# Patient Record
Sex: Male | Born: 1995
Health system: Southern US, Community
[De-identification: ages and names within clinical notes are randomized; demographics above are authoritative.]

## PROBLEM LIST (undated history)

## (undated) DIAGNOSIS — F32A Depression, unspecified: Secondary | ICD-10-CM

## (undated) DIAGNOSIS — G43019 Migraine without aura, intractable, without status migrainosus: Secondary | ICD-10-CM

## (undated) DIAGNOSIS — F410 Panic disorder [episodic paroxysmal anxiety] without agoraphobia: Secondary | ICD-10-CM

## (undated) DIAGNOSIS — T7840XA Allergy, unspecified, initial encounter: Secondary | ICD-10-CM

## (undated) DIAGNOSIS — F329 Major depressive disorder, single episode, unspecified: Secondary | ICD-10-CM

## (undated) DIAGNOSIS — G250 Essential tremor: Secondary | ICD-10-CM

## (undated) HISTORY — DX: Essential tremor: G25.0

## (undated) HISTORY — DX: Migraine without aura, intractable, without status migrainosus: G43.019

## (undated) HISTORY — PX: FINGER SURGERY: SHX640

## (undated) HISTORY — DX: Allergy, unspecified, initial encounter: T78.40XA

---
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
## 2013-02-20 ENCOUNTER — Encounter: Payer: Self-pay | Admitting: Family Medicine

## 2013-02-20 ENCOUNTER — Ambulatory Visit (INDEPENDENT_AMBULATORY_CARE_PROVIDER_SITE_OTHER): Payer: Medicaid Other | Admitting: Family Medicine

## 2013-02-20 VITALS — BP 130/74 | HR 58 | Temp 98.1°F | Resp 14 | Ht 75.0 in | Wt 214.0 lb

## 2013-02-20 DIAGNOSIS — Z9109 Other allergy status, other than to drugs and biological substances: Secondary | ICD-10-CM

## 2013-02-20 DIAGNOSIS — T7840XA Allergy, unspecified, initial encounter: Secondary | ICD-10-CM | POA: Insufficient documentation

## 2013-02-20 MED ORDER — LORATADINE 10 MG PO TABS: 10.0000 mg | ORAL_TABLET | Freq: Every day | ORAL | Status: DC

## 2013-02-20 MED ORDER — FLUTICASONE PROPIONATE 50 MCG/ACT NA SUSP: 2.0000 | Freq: Every day | NASAL | Status: DC

## 2013-02-24 ENCOUNTER — Encounter: Payer: Self-pay | Admitting: Family Medicine

## 2013-02-24 NOTE — Progress Notes (Signed)
  Subjective:    Patient ID: Wyatt Alexander, male    DOB: [birth year], 17 y.o.   MRN: [id number]  HPI  New patient here to establish her allergies.  He ports constant rhinorrhea, sneezing, itchy watery eyes, cough, sinus congestion for several weeks. In the past he has been on Claritin 10 mg by mouth daily and Flonase nasal spray. This is work adequately in the past. He denies any sinus pain. He denies any fever. He denies any headaches.  He is not currently taking any medication for his allergies.  He denies any history of eczema or asthma. Past Medical History  Diagnosis Date  . Allergy    No current outpatient prescriptions on file prior to visit.   No current facility-administered medications on file prior to visit.   History   Social History  . Marital Status: Single    Spouse Name: N/A    Number of Children: N/A  . Years of Education: N/A   Occupational History  . Not on file.   Social History Main Topics  . Smoking status: Never Smoker   . Smokeless tobacco: Never Used  . Alcohol Use: No  . Drug Use: No  . Sexually Active: Not on file     Comment: Attending community college with plans to transfer to 4 year institution.   Other Topics Concern  . Not on file   Social History Narrative  . No narrative on file   No family history on file. His mom and dad are both living he has 2 sisters and 2 brothers without significant medical problems. .  Review of Systems  Constitutional: Negative.   HENT: Positive for congestion, rhinorrhea, sneezing and postnasal drip. Negative for hearing loss, ear pain, nosebleeds, tinnitus and ear discharge.   Eyes: Positive for itching. Negative for pain, discharge, redness and visual disturbance.  Respiratory: Positive for cough. Negative for apnea, choking, chest tightness, shortness of breath and wheezing.   Cardiovascular: Negative.   Gastrointestinal: Negative.   Allergic/Immunologic: Positive for environmental allergies. Negative for  food allergies and immunocompromised state.       Objective:   Physical Exam  Constitutional: He appears well-developed and well-nourished.  HENT:  Head: Normocephalic and atraumatic.  Right Ear: External ear normal.  Left Ear: External ear normal.  Nose: Nose normal.  Mouth/Throat: Oropharynx is clear and moist.  Eyes: Conjunctivae and EOM are normal. Pupils are equal, round, and reactive to light.  Neck: Normal range of motion. Neck supple. No JVD present. No thyromegaly present.  Cardiovascular: Normal rate, regular rhythm and normal heart sounds.  Exam reveals no gallop and no friction rub.   No murmur heard. Pulmonary/Chest: Effort normal and breath sounds normal. No respiratory distress. He has no wheezes. He has no rales. He exhibits no tenderness.  Abdominal: Soft. Bowel sounds are normal. He exhibits no distension. There is no tenderness. There is no rebound.  Lymphadenopathy:    He has no cervical adenopathy.          Assessment & Plan:  1. Environmental allergies Prescribed patient Claritin 10 mg by mouth daily and Flonase 2 sprays each nostril inhaled daily. If symptoms do not improve could consider adding Singulair. Follow up when necessary.

## 2014-02-13 ENCOUNTER — Ambulatory Visit: Payer: Medicaid Other | Admitting: Family Medicine

## 2014-02-13 ENCOUNTER — Encounter: Payer: Self-pay | Admitting: Family Medicine

## 2014-02-13 ENCOUNTER — Ambulatory Visit (INDEPENDENT_AMBULATORY_CARE_PROVIDER_SITE_OTHER): Payer: Medicaid Other | Admitting: Family Medicine

## 2014-02-13 VITALS — BP 128/74 | HR 76 | Temp 98.2°F | Resp 14 | Ht 74.0 in | Wt 245.0 lb

## 2014-02-13 DIAGNOSIS — G250 Essential tremor: Secondary | ICD-10-CM | POA: Insufficient documentation

## 2014-02-13 DIAGNOSIS — G252 Other specified forms of tremor: Secondary | ICD-10-CM

## 2014-02-13 MED ORDER — LORATADINE 10 MG PO TABS: 10.0000 mg | ORAL_TABLET | Freq: Every day | ORAL | Status: DC

## 2014-02-13 MED ORDER — PROPRANOLOL HCL ER 60 MG PO CP24: 60.0000 mg | ORAL_CAPSULE | Freq: Every day | ORAL | Status: DC

## 2014-02-13 MED ORDER — FLUTICASONE PROPIONATE 50 MCG/ACT NA SUSP: 2.0000 | Freq: Every day | NASAL | Status: DC

## 2014-02-13 NOTE — Progress Notes (Signed)
   Subjective:    Patient ID: Wyatt MalletDaniel Alexander, male    DOB: 12-02-1995, 18 y.o.   MRN: [id number]  HPI Patient presents today with his mother. He is requesting a refill on his allergy medicine. He is also concerned about a tremor that he is developed in both hands. It has been there for over a year. It is a fine resting tremor. It is worse whenever he tries to concentrate and hold his hands steady.  He denies any other neurologic deficits. He has no evidence of hyperreflexia or other upper motor neuron symptoms.  He denies caffeine abuse. He denies stimulant abuse. He denies anxiety. Past Medical History  Diagnosis Date  . Allergy   . Essential tremor    No current outpatient prescriptions on file prior to visit.   No current facility-administered medications on file prior to visit.   No Known Allergies History   Social History  . Marital Status: Single    Spouse Name: N/A    Number of Children: N/A  . Years of Education: N/A   Occupational History  . Not on file.   Social History Main Topics  . Smoking status: Never Smoker   . Smokeless tobacco: Never Used  . Alcohol Use: No  . Drug Use: No  . Sexual Activity: Not on file     Comment: Attending community college with plans to transfer to 4 year institution.   Other Topics Concern  . Not on file   Social History Narrative  . No narrative on file      Review of Systems  All other systems reviewed and are negative.      Objective:   Physical Exam  Vitals reviewed. Constitutional: He is oriented to person, place, and time. He appears well-developed and well-nourished. No distress.  HENT:  Head: Normocephalic and atraumatic.  Right Ear: External ear normal.  Left Ear: External ear normal.  Nose: Nose normal.  Mouth/Throat: Oropharynx is clear and moist.  Eyes: Conjunctivae and EOM are normal. Pupils are equal, round, and reactive to light. Right eye exhibits no discharge. Left eye exhibits no discharge. No scleral  icterus.  Neck: Neck supple. No thyromegaly present.  Cardiovascular: Normal rate, regular rhythm, normal heart sounds and intact distal pulses.  Exam reveals no gallop and no friction rub.   No murmur heard. Pulmonary/Chest: Effort normal and breath sounds normal. No respiratory distress. He has no wheezes. He has no rales. He exhibits no tenderness.  Lymphadenopathy:    He has no cervical adenopathy.  Neurological: He is alert and oriented to person, place, and time. He has normal reflexes. He displays normal reflexes. No cranial nerve deficit. He exhibits normal muscle tone. Coordination normal.  Skin: He is not diaphoretic.          Assessment & Plan:  1. Essential tremor Try Inderal LA 60 mg by mouth daily. Recheck in one month.  Monitor for hypotension or fatigue. - propranolol ER (INDERAL LA) 60 MG 24 hr capsule; Take 1 capsule (60 mg total) by mouth daily.  Dispense: 30 capsule; Refill: 5

## 2014-02-20 ENCOUNTER — Ambulatory Visit: Payer: Medicaid Other | Admitting: Family Medicine

## 2014-09-03 ENCOUNTER — Ambulatory Visit: Payer: Medicaid Other

## 2015-03-18 ENCOUNTER — Encounter: Payer: Self-pay | Admitting: Family Medicine

## 2015-03-18 ENCOUNTER — Ambulatory Visit (INDEPENDENT_AMBULATORY_CARE_PROVIDER_SITE_OTHER): Payer: Medicaid Other | Admitting: Family Medicine

## 2015-03-18 VITALS — BP 108/64 | HR 68 | Temp 98.5°F | Resp 14 | Ht 74.0 in | Wt 228.0 lb

## 2015-03-18 DIAGNOSIS — G250 Essential tremor: Secondary | ICD-10-CM | POA: Diagnosis not present

## 2015-03-18 DIAGNOSIS — R5383 Other fatigue: Secondary | ICD-10-CM | POA: Diagnosis not present

## 2015-03-18 LAB — CBC WITH DIFFERENTIAL/PLATELET
Basophils Absolute: 0.1 10*3/uL (ref 0.0–0.1)
Basophils Relative: 1 % (ref 0–1)
EOS ABS: 0.2 10*3/uL (ref 0.0–0.7)
Eosinophils Relative: 3 % (ref 0–5)
HEMATOCRIT: 45.6 % (ref 39.0–52.0)
HEMOGLOBIN: 16.1 g/dL (ref 13.0–17.0)
LYMPHS ABS: 1.8 10*3/uL (ref 0.7–4.0)
Lymphocytes Relative: 28 % (ref 12–46)
MCH: 30.3 pg (ref 26.0–34.0)
MCHC: 35.3 g/dL (ref 30.0–36.0)
MCV: 85.9 fL (ref 78.0–100.0)
MONOS PCT: 12 % (ref 3–12)
MPV: 10.2 fL (ref 8.6–12.4)
Monocytes Absolute: 0.8 10*3/uL (ref 0.1–1.0)
NEUTROS ABS: 3.5 10*3/uL (ref 1.7–7.7)
NEUTROS PCT: 56 % (ref 43–77)
Platelets: 183 10*3/uL (ref 150–400)
RBC: 5.31 MIL/uL (ref 4.22–5.81)
RDW: 13.9 % (ref 11.5–15.5)
WBC: 6.3 10*3/uL (ref 4.0–10.5)

## 2015-03-18 LAB — COMPLETE METABOLIC PANEL WITH GFR
ALT: 30 U/L (ref 0–53)
AST: 18 U/L (ref 0–37)
Albumin: 4.6 g/dL (ref 3.5–5.2)
Alkaline Phosphatase: 78 U/L (ref 39–117)
BILIRUBIN TOTAL: 1.2 mg/dL (ref 0.2–1.2)
BUN: 15 mg/dL (ref 6–23)
CALCIUM: 10 mg/dL (ref 8.4–10.5)
CHLORIDE: 103 meq/L (ref 96–112)
CO2: 27 meq/L (ref 19–32)
CREATININE: 0.81 mg/dL (ref 0.50–1.35)
Glucose, Bld: 82 mg/dL (ref 70–99)
Potassium: 3.7 mEq/L (ref 3.5–5.3)
SODIUM: 139 meq/L (ref 135–145)
TOTAL PROTEIN: 6.9 g/dL (ref 6.0–8.3)

## 2015-03-18 LAB — TSH: TSH: 2.247 u[IU]/mL (ref 0.350–4.500)

## 2015-03-18 MED ORDER — FLUTICASONE PROPIONATE 50 MCG/ACT NA SUSP: 2.0000 | Freq: Every day | NASAL | Status: DC

## 2015-03-18 MED ORDER — PROPRANOLOL HCL ER 60 MG PO CP24: 60.0000 mg | ORAL_CAPSULE | Freq: Every day | ORAL | Status: DC

## 2015-03-18 MED ORDER — LORATADINE 10 MG PO TABS: 10.0000 mg | ORAL_TABLET | Freq: Every day | ORAL | Status: DC

## 2015-03-18 NOTE — Progress Notes (Signed)
   Subjective:    Patient ID: Wyatt Alexander, male    DOB: Nov 06, 1996, 19 y.o.   MRN: [id number]  HPI  Patient takes Flonase and Claritin for seasonal allergies. These medications work well to help manage his allergies. He also takes propranolol extended release 60 mg 1 by mouth daily to control an essential tremor. However without the medication he does not have severe problems with tremor. He is not sure whether he needs to continue the medication or not. He does report fatigue. He also reports sleepiness although his schedule is typically going to bed 2 or 3 in the morning and waking up at 10 or 11 in the morning. He denies any chest pain shortness of breath or weight loss. He denies any fevers or chills. He denies any depression or anxiety Past Medical History  Diagnosis Date  . Allergy   . Essential tremor    No past surgical history on file. No current outpatient prescriptions on file prior to visit.   No current facility-administered medications on file prior to visit.   No Known Allergies History   Social History  . Marital Status: Single    Spouse Name: N/A  . Number of Children: N/A  . Years of Education: N/A   Occupational History  . Not on file.   Social History Main Topics  . Smoking status: Never Smoker   . Smokeless tobacco: Never Used  . Alcohol Use: No  . Drug Use: No  . Sexual Activity: Not on file     Comment: Attending community college with plans to transfer to 4 year institution.   Other Topics Concern  . Not on file   Social History Narrative     Review of Systems  All other systems reviewed and are negative.      Objective:   Physical Exam  Constitutional: He is oriented to person, place, and time.  Neck: Neck supple. No thyromegaly present.  Cardiovascular: Normal rate, regular rhythm and normal heart sounds.   No murmur heard. Pulmonary/Chest: Effort normal and breath sounds normal. No respiratory distress. He has no wheezes. He has no  rales.  Abdominal: Soft. Bowel sounds are normal. He exhibits no distension. There is no tenderness. There is no rebound.  Lymphadenopathy:    He has no cervical adenopathy.  Neurological: He is alert and oriented to person, place, and time. He has normal reflexes. He displays normal reflexes. No cranial nerve deficit. He exhibits normal muscle tone. Coordination normal.  Vitals reviewed.         Assessment & Plan:  Essential tremor - Plan: propranolol ER (INDERAL LA) 60 MG 24 hr capsule, CBC with Differential/Platelet, COMPLETE METABOLIC PANEL WITH GFR, TSH  Other fatigue - Plan: CBC with Differential/Platelet, COMPLETE METABOLIC PANEL WITH GFR, TSH  Patient's essential tremor is well controlled on propranolol. I will give him the option of whether or not he wants to continue this medication if it is causing him to be tired and fatigue. I will check a CBC, CMP, and TSH to workup other potential causes of fatigue. I will gladly refill the medication he takes for allergies.

## 2015-03-19 ENCOUNTER — Encounter: Payer: Self-pay | Admitting: Family Medicine

## 2015-05-04 ENCOUNTER — Ambulatory Visit (INDEPENDENT_AMBULATORY_CARE_PROVIDER_SITE_OTHER): Payer: Medicaid Other | Admitting: Family Medicine

## 2015-05-04 DIAGNOSIS — Z23 Encounter for immunization: Secondary | ICD-10-CM | POA: Diagnosis not present

## 2015-12-24 ENCOUNTER — Ambulatory Visit: Payer: Medicaid Other | Admitting: Family Medicine

## 2015-12-29 ENCOUNTER — Ambulatory Visit (INDEPENDENT_AMBULATORY_CARE_PROVIDER_SITE_OTHER): Payer: Self-pay | Admitting: Physician Assistant

## 2015-12-29 ENCOUNTER — Encounter: Payer: Self-pay | Admitting: Physician Assistant

## 2015-12-29 VITALS — BP 114/70 | HR 56 | Temp 98.4°F | Resp 18 | Wt 221.0 lb

## 2015-12-29 DIAGNOSIS — H6123 Impacted cerumen, bilateral: Secondary | ICD-10-CM

## 2015-12-29 NOTE — Progress Notes (Signed)
    Patient ID: Wyatt Alexander MRN: [id number], DOB: Oct 25, 1996, 20 y.o. Date of Encounter: 12/29/2015, 11:52 AM    Chief Complaint:  Chief Complaint  Patient presents with  . c/o plugged ears     HPI: 20 y.o. year old white male says his ears have felt clogged up for quite some time that it is getting worse so finally came in. No other complaints or concerns.     Home Meds:   Outpatient Prescriptions Prior to Visit  Medication Sig Dispense Refill  . fluticasone (FLONASE) 50 MCG/ACT nasal spray Place 2 sprays into both nostrils daily. 16 g 11  . loratadine (CLARITIN) 10 MG tablet Take 1 tablet (10 mg total) by mouth daily. 30 tablet 11  . propranolol ER (INDERAL LA) 60 MG 24 hr capsule Take 1 capsule (60 mg total) by mouth daily. (Patient not taking: Reported on 12/29/2015) 30 capsule 5   No facility-administered medications prior to visit.    Allergies: No Known Allergies    Review of Systems: See HPI for pertinent ROS. All other ROS negative.    Physical Exam: Blood pressure 114/70, pulse 56, temperature 98.4 F (36.9 C), temperature source Oral, resp. rate 18, weight 221 lb (100.245 kg)., Body mass index is 28.36 kg/(m^2). General:  WNWD WM. Appears in no acute distress. HEENT: Normocephalic, atraumatic, eyes without discharge, sclera non-icteric, nares are without discharge. Bilateral auditory canals with cerumen impaction/obstruction.  Oral cavity moist, posterior pharynx without exudate, erythema, peritonsillar abscess, or post nasal drip.  Neck: Supple. No thyromegaly. No lymphadenopathy. Lungs: Clear bilaterally to auscultation without wheezes, rales, or rhonchi. Breathing is unlabored. Heart: Regular rhythm. No murmurs, rubs, or gallops. Msk:  Strength and tone normal for age. Extremities/Skin: Warm and dry.  Neuro: Alert and oriented X 3. Moves all extremities spontaneously. Gait is normal. CNII-XII grossly in tact. Psych:  Responds to questions appropriately with a  normal affect.     ASSESSMENT AND PLAN:  20 y.o. year old male with   1. Cerumen impaction, bilateral We will irrigate ears bilaterally now. Told patient to use over-the-counter drops on a routine basis to prevent re-occurrence. Follow-up PRN.   [address] [place], [place], [facility] 12/29/2015 11:52 AM

## 2016-01-10 ENCOUNTER — Encounter (HOSPITAL_COMMUNITY): Payer: Self-pay | Admitting: Emergency Medicine

## 2016-01-10 ENCOUNTER — Emergency Department (HOSPITAL_COMMUNITY)
Admission: EM | Admit: 2016-01-10 | Discharge: 2016-01-10 | Disposition: A | Discharge status: Home / Self Care | Payer: Medicaid Other | Attending: Emergency Medicine | Admitting: Emergency Medicine

## 2016-01-10 DIAGNOSIS — Z79899 Other long term (current) drug therapy: Secondary | ICD-10-CM | POA: Insufficient documentation

## 2016-01-10 DIAGNOSIS — R51 Headache: Secondary | ICD-10-CM | POA: Insufficient documentation

## 2016-01-10 DIAGNOSIS — R112 Nausea with vomiting, unspecified: Secondary | ICD-10-CM

## 2016-01-10 DIAGNOSIS — R5383 Other fatigue: Secondary | ICD-10-CM | POA: Insufficient documentation

## 2016-01-10 DIAGNOSIS — R197 Diarrhea, unspecified: Secondary | ICD-10-CM | POA: Insufficient documentation

## 2016-01-10 DIAGNOSIS — R079 Chest pain, unspecified: Secondary | ICD-10-CM | POA: Insufficient documentation

## 2016-01-10 DIAGNOSIS — Z7951 Long term (current) use of inhaled steroids: Secondary | ICD-10-CM | POA: Insufficient documentation

## 2016-01-10 LAB — COMPREHENSIVE METABOLIC PANEL
ALBUMIN: 4.4 g/dL (ref 3.5–5.0)
ALT: 23 U/L (ref 17–63)
AST: 32 U/L (ref 15–41)
Alkaline Phosphatase: 78 U/L (ref 38–126)
Anion gap: 10 (ref 5–15)
BUN: 8 mg/dL (ref 6–20)
CHLORIDE: 105 mmol/L (ref 101–111)
CO2: 27 mmol/L (ref 22–32)
CREATININE: 0.84 mg/dL (ref 0.61–1.24)
Calcium: 9.5 mg/dL (ref 8.9–10.3)
GFR calc non Af Amer: >60 mL/min (ref >60)
GLUCOSE: 110 mg/dL — AB (ref 65–99)
Potassium: 3.8 mmol/L (ref 3.5–5.1)
SODIUM: 142 mmol/L (ref 135–145)
Total Bilirubin: 1.5 mg/dL — ABNORMAL HIGH (ref 0.3–1.2)
Total Protein: 7.1 g/dL (ref 6.5–8.1)

## 2016-01-10 LAB — CBC
HCT: 43.7 % (ref 39.0–52.0)
HEMOGLOBIN: 14.9 g/dL (ref 13.0–17.0)
MCH: 29.3 pg (ref 26.0–34.0)
MCHC: 34.1 g/dL (ref 30.0–36.0)
MCV: 86 fL (ref 78.0–100.0)
Platelets: 182 10*3/uL (ref 150–400)
RBC: 5.08 MIL/uL (ref 4.22–5.81)
RDW: 12.4 % (ref 11.5–15.5)
WBC: 6.8 10*3/uL (ref 4.0–10.5)

## 2016-01-10 LAB — URINE MICROSCOPIC-ADD ON: RBC / HPF: NONE SEEN RBC/hpf (ref 0–5)

## 2016-01-10 LAB — URINALYSIS, ROUTINE W REFLEX MICROSCOPIC
GLUCOSE, UA: NEGATIVE mg/dL
Hgb urine dipstick: NEGATIVE
Ketones, ur: 15 mg/dL — AB
LEUKOCYTES UA: NEGATIVE
NITRITE: NEGATIVE
PH: 7 (ref 5.0–8.0)
Protein, ur: 30 mg/dL — AB
SPECIFIC GRAVITY, URINE: 1.028 (ref 1.005–1.030)

## 2016-01-10 LAB — LIPASE, BLOOD: LIPASE: 23 U/L (ref 11–51)

## 2016-01-10 MED ORDER — ONDANSETRON 4 MG PO TBDP: 4.0000 mg | ORAL_TABLET | Freq: Three times a day (TID) | ORAL | Status: DC | PRN

## 2016-01-10 MED ORDER — SODIUM CHLORIDE 0.9 % IV BOLUS (SEPSIS)
1000.0000 mL | Freq: Once | INTRAVENOUS | Status: AC
  Administered 2016-01-10: 1000 mL via INTRAVENOUS

## 2016-01-10 MED ORDER — ONDANSETRON HCL 4 MG/2ML IJ SOLN
4.0000 mg | Freq: Once | INTRAMUSCULAR | Status: AC
  Administered 2016-01-10: 4 mg via INTRAVENOUS
  Filled 2016-01-10: qty 2

## 2016-01-10 NOTE — ED Notes (Signed)
C/o nausea and vomiting since eating chicken and yogurt that was not cold 2 days ago.  Denies abd pain and diarrhea.

## 2016-01-10 NOTE — ED Provider Notes (Signed)
CSN: [id number]     Arrival date & time 01/10/16  0059 History   First MD Initiated Contact with Patient 01/10/16 0455     Chief Complaint  Patient presents with  . Emesis    Patient is a 20 y.o. male presenting with vomiting. The history is provided by the patient.  Emesis Associated symptoms: chills and diarrhea   Associated symptoms: no abdominal pain    patient presents with nausea vomiting and had some diarrhea. Began around 2 days ago. He's had some mild chills. He is hungry but states he is nervous to eat because it makes things feel worse. Slight abdominal pain. States he did eat some chicken and yogurt that he was not sure about. Mild headache. States he has been able to keep fluids down.  Past Medical History  Diagnosis Date  . Allergy   . Essential tremor    Past Surgical History  Procedure Laterality Date  . Wrist surgery     No family history on file. Social History  Substance Use Topics  . Smoking status: Never Smoker   . Smokeless tobacco: Never Used  . Alcohol Use: No    Review of Systems  Constitutional: Positive for chills, appetite change and fatigue. Negative for fever.  Respiratory: Negative for cough.   Cardiovascular: Positive for chest pain.  Gastrointestinal: Positive for nausea, vomiting and diarrhea. Negative for abdominal pain.  Genitourinary: Negative for flank pain.  Musculoskeletal: Negative for back pain.  Skin: Negative for pallor.  Hematological: Negative for adenopathy.      Allergies  Review of patient's allergies indicates no known allergies.  Home Medications   Prior to Admission medications   Medication Sig Start Date End Date Taking? Authorizing Provider  fluticasone (FLONASE) 50 MCG/ACT nasal spray Place 2 sprays into both nostrils daily. Patient taking differently: Place 2 sprays into both nostrils daily as needed for allergies.  03/18/15 04/19/16 Yes Donita BrooksWarren T Pickard, MD  loratadine (CLARITIN) 10 MG tablet Take 1 tablet (10  mg total) by mouth daily. Patient taking differently: Take 10 mg by mouth daily as needed for allergies.  03/18/15 08/03/16 Yes Donita BrooksWarren T Pickard, MD  ondansetron (ZOFRAN-ODT) 4 MG disintegrating tablet Take 1 tablet (4 mg total) by mouth every 8 (eight) hours as needed for nausea or vomiting. 01/10/16   Benjiman CoreNathan Rosann Gorum, MD   BP 122/78 mmHg  Pulse 47  Temp(Src) 98.5 F (36.9 C) (Oral)  Resp 20  SpO2 100% Physical Exam  Constitutional: He is oriented to person, place, and time. He appears well-developed and well-nourished.  HENT:  Head: Normocephalic and atraumatic.  Eyes: Conjunctivae are normal.  Cardiovascular: Normal rate, regular rhythm and normal heart sounds.   No murmur heard. Pulmonary/Chest: Effort normal and breath sounds normal.  Abdominal: Soft. He exhibits no distension. There is no tenderness. There is no guarding.  Musculoskeletal: Normal range of motion. He exhibits no edema.  Neurological: He is alert and oriented to person, place, and time.  Skin: Skin is warm and dry.  Psychiatric: He has a normal mood and affect.  Nursing note and vitals reviewed.   ED Course  Procedures (including critical care time) Labs Review Labs Reviewed  COMPREHENSIVE METABOLIC PANEL - Abnormal; Notable for the following:    Glucose, Bld 110 (*)    Total Bilirubin 1.5 (*)    All other components within normal limits  URINALYSIS, ROUTINE W REFLEX MICROSCOPIC (NOT AT [facility]) - Abnormal; Notable for the following:    Color, Urine AMBER (*)  Bilirubin Urine SMALL (*)    Ketones, ur 15 (*)    Protein, ur 30 (*)    All other components within normal limits  URINE MICROSCOPIC-ADD ON - Abnormal; Notable for the following:    Squamous Epithelial / LPF 0-5 (*)    Bacteria, UA RARE (*)    All other components within normal limits  LIPASE, BLOOD  CBC    Imaging Review No results found. I have personally reviewed and evaluated these images and lab results as part of my medical  decision-making.   EKG Interpretation None      MDM   Final diagnoses:  Non-intractable vomiting with nausea, vomiting of unspecified type    Patient with nausea and vomiting. Some dehydration on labs but feels better after treatment. Has tolerated orals will be discharged home.    Benjiman Core, MD 01/10/16 [phone]

## 2016-01-10 NOTE — ED Notes (Signed)
Cup of ice water provided to patient for PO fluid challenge. Patient denies nausea or pain at this time.

## 2016-01-10 NOTE — Discharge Instructions (Signed)

## 2016-09-24 ENCOUNTER — Emergency Department (HOSPITAL_COMMUNITY): Payer: BLUE CROSS/BLUE SHIELD

## 2016-09-24 ENCOUNTER — Encounter (HOSPITAL_COMMUNITY): Payer: Self-pay | Admitting: *Deleted

## 2016-09-24 ENCOUNTER — Emergency Department (HOSPITAL_COMMUNITY)
Admission: EM | Admit: 2016-09-24 | Discharge: 2016-09-24 | Disposition: A | Discharge status: Home / Self Care | Payer: BLUE CROSS/BLUE SHIELD | Attending: Emergency Medicine | Admitting: Emergency Medicine

## 2016-09-24 DIAGNOSIS — Y929 Unspecified place or not applicable: Secondary | ICD-10-CM | POA: Insufficient documentation

## 2016-09-24 DIAGNOSIS — Y999 Unspecified external cause status: Secondary | ICD-10-CM | POA: Diagnosis not present

## 2016-09-24 DIAGNOSIS — S93401A Sprain of unspecified ligament of right ankle, initial encounter: Secondary | ICD-10-CM | POA: Insufficient documentation

## 2016-09-24 DIAGNOSIS — Y9367 Activity, basketball: Secondary | ICD-10-CM | POA: Insufficient documentation

## 2016-09-24 DIAGNOSIS — X509XXA Other and unspecified overexertion or strenuous movements or postures, initial encounter: Secondary | ICD-10-CM | POA: Insufficient documentation

## 2016-09-24 DIAGNOSIS — S99911A Unspecified injury of right ankle, initial encounter: Secondary | ICD-10-CM | POA: Diagnosis present

## 2016-09-24 MED ORDER — IBUPROFEN 400 MG PO TABS
400.0000 mg | ORAL_TABLET | Freq: Once | ORAL | Status: AC
  Administered 2016-09-24: 400 mg via ORAL
  Filled 2016-09-24: qty 1

## 2016-09-24 MED ORDER — HYDROCODONE-ACETAMINOPHEN 5-325 MG PO TABS: 1.0000 | ORAL_TABLET | Freq: Four times a day (QID) | ORAL | 0 refills | Status: DC | PRN

## 2016-09-24 MED ORDER — DICLOFENAC SODIUM 50 MG PO TBEC: 50.0000 mg | DELAYED_RELEASE_TABLET | Freq: Two times a day (BID) | ORAL | 0 refills | Status: DC

## 2016-09-24 MED ORDER — HYDROCODONE-ACETAMINOPHEN 5-325 MG PO TABS
1.0000 | ORAL_TABLET | Freq: Once | ORAL | Status: AC
  Administered 2016-09-24: 1 via ORAL
  Filled 2016-09-24: qty 1

## 2016-09-24 NOTE — Discharge Instructions (Signed)
Follow up with Dr. Roda ShuttersXu for your ankle sprain. Do not drive while taking the narcotic pain medication as it will make you sleepy.return here as needed.

## 2016-09-24 NOTE — ED Provider Notes (Signed)
[facility]-EMERGENCY DEPT Provider Note   CSN: [id number] Arrival date & time: 09/24/16  2049  By signing my name below, I, Clovis Pu, attest that this documentation has been prepared under the direction and in the presence of  Kerrie Buffalo, NP. Electronically Signed: Clovis Pu, ED Scribe. 09/24/16. 9:28 PM.   History   Chief Complaint Chief Complaint  Patient presents with  . Ankle Pain   The history is provided by the patient. No language interpreter was used.  Ankle Pain   The incident occurred 1 to 2 hours ago. The injury mechanism was a fall. The pain is present in the right ankle. The pain is at a severity of 7/10. The pain is moderate. The pain has been constant since onset. Pertinent negatives include no numbness and no loss of sensation. The symptoms are aggravated by bearing weight. He has tried nothing for the symptoms.   HPI Comments:  Wyatt Alexander is a 20 y.o. male who presents to the Emergency Department complaining of sudden onset "7/10" right ankle pain s/p an incident which occurred 1 hour prior to arrival. Pt states he was playing basketball when he rolled his right ankle. Pain is worse with certain movements. No alleviating factors noted. Pt denies any associated symptoms or modifying factors at this time.   Past Medical History:  Diagnosis Date  . Allergy   . Essential tremor     Patient Active Problem List   Diagnosis Date Noted  . Essential tremor   . Allergy   . Concussion 03/24/2012    Past Surgical History:  Procedure Laterality Date  . WRIST SURGERY         Home Medications    Prior to Admission medications   Medication Sig Start Date End Date Taking? Authorizing Provider  diclofenac (VOLTAREN) 50 MG EC tablet Take 1 tablet (50 mg total) by mouth 2 (two) times daily. 09/24/16   Lowen Barringer Orlene Och, NP  fluticasone (FLONASE) 50 MCG/ACT nasal spray Place 2 sprays into both nostrils daily. Patient taking differently: Place 2 sprays into both nostrils  daily as needed for allergies.  03/18/15 04/19/16  Donita Brooks, MD  HYDROcodone-acetaminophen (NORCO) 5-325 MG tablet Take 1 tablet by mouth every 6 (six) hours as needed. 09/24/16   Lurlean Kernen Orlene Och, NP  loratadine (CLARITIN) 10 MG tablet Take 1 tablet (10 mg total) by mouth daily. Patient taking differently: Take 10 mg by mouth daily as needed for allergies.  03/18/15 08/03/16  Donita Brooks, MD  ondansetron (ZOFRAN-ODT) 4 MG disintegrating tablet Take 1 tablet (4 mg total) by mouth every 8 (eight) hours as needed for nausea or vomiting. 01/10/16   Benjiman Core, MD    Family History No family history on file.  Social History Social History  Substance Use Topics  . Smoking status: Never Smoker  . Smokeless tobacco: Never Used  . Alcohol use No     Allergies   Patient has no known allergies.   Review of Systems Review of Systems  Musculoskeletal: Positive for arthralgias and joint swelling.  Neurological: Negative for numbness.     Physical Exam Updated Vital Signs BP 128/67 (BP Location: Right Arm)   Pulse (!) 56   Temp 98.4 F (36.9 C) (Oral)   Resp 12   Wt 81.7 kg   SpO2 97%   BMI 23.13 kg/m   Physical Exam  Constitutional: He is oriented to person, place, and time. He appears well-developed and well-nourished. No distress.  HENT:  Head:  Normocephalic and atraumatic.  Eyes: Conjunctivae are normal.  Cardiovascular: Normal rate.   Pedal pulses 2+  Pulmonary/Chest: Effort normal.  Abdominal: He exhibits no distension.  Musculoskeletal: He exhibits edema and tenderness.       Right ankle: He exhibits swelling. Tenderness.  Large amount of swelling to the outer aspect of the right ankle. TTP. plantar flexion and dorsiflexion without difficulty. Normal achilles.   Neurological: He is alert and oriented to person, place, and time.  Skin: Skin is warm and dry.  Adequate circulation.  Psychiatric: He has a normal mood and affect.  Nursing note and vitals  reviewed.   ED Treatments / Results  DIAGNOSTIC STUDIES:  Oxygen Saturation is 97% on RA, normal by my interpretation.    COORDINATION OF CARE:  9:22 PM Discussed treatment plan with pt at bedside and pt agreed to plan.  Labs (all labs ordered are listed, but only abnormal results are displayed) Labs Reviewed - No data to display   Radiology Dg Ankle Complete Right  Result Date: 09/24/2016 CLINICAL DATA:  Rolling type injury while playing basketball EXAM: RIGHT ANKLE - COMPLETE 3+ VIEW COMPARISON:  None. FINDINGS: Frontal, oblique, and lateral views were obtained. There is soft tissue swelling laterally. There is no demonstrable fracture or joint effusion. The ankle mortise appears intact. There is no appreciable joint space narrowing. IMPRESSION: Soft tissue swelling laterally. No fracture or joint space narrowing. The ankle mortise appears intact. Electronically Signed   By: Bretta BangWilliam  Woodruff III M.D.   On: 09/24/2016 21:50   Dg Foot Complete Right  Result Date: 09/24/2016 CLINICAL DATA:  Rolling type injury while playing basketball EXAM: RIGHT FOOT COMPLETE - 3+ VIEW COMPARISON:  None. FINDINGS: Frontal, oblique, and lateral views were obtained. There is no fracture or dislocation. The joint spaces appear normal. No erosive change. IMPRESSION: No fracture or dislocation.  No appreciable arthropathy. Electronically Signed   By: Bretta BangWilliam  Woodruff III M.D.   On: 09/24/2016 21:51    Procedures Procedures (including critical care time)  Medications Ordered in ED Medications  HYDROcodone-acetaminophen (NORCO/VICODIN) 5-325 MG per tablet 1 tablet (1 tablet Oral Given 09/24/16 2131)  ibuprofen (ADVIL,MOTRIN) tablet 400 mg (400 mg Oral Given 09/24/16 2131)     Initial Impression / Assessment and Plan / ED Course  I have reviewed the triage vital signs and the nursing notes.  Pertinent imaging results that were available during my care of the patient were reviewed by me and  considered in my medical decision making (see chart for details).  Clinical Course     Patient X-Ray negative for obvious fracture or dislocation.  Pt advised to follow up with orthopedics. Patient given air ankle splint and crutches while in ED, conservative therapy recommended and discussed. Patient will be discharged home & is agreeable with above plan. Returns precautions discussed. Pt appears safe for discharge.  Final Clinical Impressions(s) / ED Diagnoses   Final diagnoses:  Sprain of right ankle, unspecified ligament, initial encounter    New Prescriptions Discharge Medication List as of 09/24/2016 10:07 PM    START taking these medications   Details  diclofenac (VOLTAREN) 50 MG EC tablet Take 1 tablet (50 mg total) by mouth 2 (two) times daily., Starting Sun 09/24/2016, Print    HYDROcodone-acetaminophen (NORCO) 5-325 MG tablet Take 1 tablet by mouth every 6 (six) hours as needed., Starting Sun 09/24/2016, Print      I personally performed the services described in this documentation, which was scribed in my presence.  The recorded information has been reviewed and is accurate.     [address]Mylen Mangan [place] Kelly Eisler, NP 09/25/16 [id number]    Geoffery Lyonsouglas Delo, MD 09/25/16 [phone]

## 2016-09-24 NOTE — Progress Notes (Signed)
Orthopedic Tech Progress Note Patient Details:  Earney MalletDaniel Kujawa 02-May-1996 [id number]  Ortho Devices Type of Ortho Device: Crutches, Ankle Air splint Ortho Device/Splint Location: Right Ankle/ Applied AirCast and Crutches with training. Ortho Device/Splint Interventions: Application, Adjustment   Alvina ChouWilliams, Shermar C 09/24/2016, 10:48 PM

## 2016-09-24 NOTE — ED Triage Notes (Signed)
The pt is c/o rt ankle pain today when someone ran over his foot with their foot while playing basketball

## 2016-09-26 ENCOUNTER — Ambulatory Visit (INDEPENDENT_AMBULATORY_CARE_PROVIDER_SITE_OTHER): Payer: BLUE CROSS/BLUE SHIELD | Admitting: Orthopaedic Surgery

## 2016-09-26 ENCOUNTER — Telehealth: Payer: Self-pay | Admitting: Family Medicine

## 2016-09-26 ENCOUNTER — Encounter (INDEPENDENT_AMBULATORY_CARE_PROVIDER_SITE_OTHER): Payer: Self-pay | Admitting: Orthopaedic Surgery

## 2016-09-26 DIAGNOSIS — S93401A Sprain of unspecified ligament of right ankle, initial encounter: Secondary | ICD-10-CM

## 2016-09-26 NOTE — Telephone Encounter (Signed)
In ED 09/24/16 for bad sprain ankle.  They gave him some Hydrocodone 5/325 #15.  Has appt with Eulah PontMurphy Thurston [facility] for 10/03/16.  Needs refill of pain meds.  Please advise?

## 2016-09-26 NOTE — Progress Notes (Signed)
Office Visit Note   Patient: Wyatt Alexander           Date of Birth: [birth year]           MRN: [id number] Visit Date: 09/26/2016              Requested by: Dorena BodoMary B Dixon, PA-C [address] [place], [place] [id number] PCP: Frazier RichardsIXON,MARY BETH, PA-C   Assessment & Plan: Visit Diagnoses:  1. Sprain of unspecified ligament of right ankle, initial encounter     Plan: X-rays from canopy or negative. Recommend ASO brace and physical therapy. They increase activity as tolerated follow up as needed.  Follow-Up Instructions: Return if symptoms worsen or fail to improve.   Orders:  No orders of the defined types were placed in this encounter.  No orders of the defined types were placed in this encounter.     Procedures: No procedures performed   Clinical Data: No additional findings.   Subjective: Chief Complaint  Patient presents with  . Right Ankle - Pain    Wyatt Alexander is a healthy 20 year old gentleman who sprained his ankle playing basketball on Sunday. He presented to the ER and follows up today. He says that he mainly has discomfort and he does endorse swelling and bruising. X-rays in the ER were negative. He works at [employer]. He's been told to be nonweightbearing. He is ambulating with crutches. Pain does not radiate.    Review of Systems  Constitutional: Negative.   HENT: Negative.   Eyes: Negative.   Respiratory: Negative.   Cardiovascular: Negative.   Gastrointestinal: Negative.   Endocrine: Negative.   Genitourinary: Negative.   Musculoskeletal: Negative.   Skin: Negative.   Allergic/Immunologic: Negative.   Neurological: Negative.   Hematological: Negative.   Psychiatric/Behavioral: Negative.      Objective: Vital Signs: There were no vitals taken for this visit.  Physical Exam  Constitutional: He is oriented to person, place, and time. He appears well-developed and well-nourished.  HENT:  Head: Normocephalic and atraumatic.  Eyes: EOM are  normal.  Neck: Neck supple.  Cardiovascular: Intact distal pulses.   Pulmonary/Chest: Effort normal.  Abdominal: Soft.  Neurological: He is alert and oriented to person, place, and time.  Skin: Skin is warm.  Psychiatric: He has a normal mood and affect. His behavior is normal. Judgment and thought content normal.  Nursing note and vitals reviewed.   Ortho Exam Exam of the right ankle shows moderate to severe bruising and swelling. His ankle and subtalar joints are stable. He is neurovascularly intact. Sensation intact. Her over the ATFL. Specialty Comments:  No specialty comments available.  Imaging: No results found.   PMFS History: Patient Active Problem List   Diagnosis Date Noted  . Sprain of unspecified ligament of right ankle, initial encounter 09/26/2016  . Essential tremor   . Allergy   . Concussion 03/24/2012   Past Medical History:  Diagnosis Date  . Allergy   . Essential tremor     History reviewed. No pertinent family history.  Past Surgical History:  Procedure Laterality Date  . WRIST SURGERY     Social History   Occupational History  . Not on file.   Social History Main Topics  . Smoking status: Never Smoker  . Smokeless tobacco: Never Used  . Alcohol use No  . Drug use: No  . Sexual activity: Not on file     Comment: Attending community college with plans to transfer to 4 year institution.

## 2016-09-27 MED ORDER — DICLOFENAC SODIUM 50 MG PO TBEC: 50.0000 mg | DELAYED_RELEASE_TABLET | Freq: Two times a day (BID) | ORAL | 0 refills | Status: DC

## 2016-09-27 NOTE — Telephone Encounter (Signed)
I would not prescribe opiates for an ankle sprain.  Stay off ankle, elevate ankle and ice the ankle.  Begin diclofenac 75 bid

## 2016-09-27 NOTE — Telephone Encounter (Signed)
Tried to call patient and mother, no answer.  Refill of Diclofenac sent to pharmacy.

## 2017-02-27 ENCOUNTER — Encounter: Payer: Self-pay | Admitting: Physician Assistant

## 2017-03-15 ENCOUNTER — Encounter: Payer: Self-pay | Admitting: Family Medicine

## 2017-03-15 ENCOUNTER — Ambulatory Visit (INDEPENDENT_AMBULATORY_CARE_PROVIDER_SITE_OTHER): Payer: BLUE CROSS/BLUE SHIELD | Admitting: Family Medicine

## 2017-03-15 VITALS — BP 130/56 | HR 58 | Temp 98.2°F | Resp 14 | Ht 74.0 in | Wt 174.0 lb

## 2017-03-15 DIAGNOSIS — F411 Generalized anxiety disorder: Secondary | ICD-10-CM | POA: Diagnosis not present

## 2017-03-15 MED ORDER — ALPRAZOLAM 0.5 MG PO TABS: 0.5000 mg | ORAL_TABLET | Freq: Three times a day (TID) | ORAL | 0 refills | Status: DC | PRN

## 2017-03-15 MED ORDER — ESCITALOPRAM OXALATE 10 MG PO TABS: 10.0000 mg | ORAL_TABLET | Freq: Every day | ORAL | 3 refills | Status: DC
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
## 2017-04-02 ENCOUNTER — Encounter: Payer: Self-pay | Admitting: Physician Assistant

## 2017-05-24 ENCOUNTER — Encounter: Payer: Self-pay | Admitting: Physician Assistant

## 2017-07-26 ENCOUNTER — Emergency Department (INDEPENDENT_AMBULATORY_CARE_PROVIDER_SITE_OTHER)
Admission: EM | Admit: 2017-07-26 | Discharge: 2017-07-26 | Disposition: A | Discharge status: Home / Self Care | Payer: BLUE CROSS/BLUE SHIELD | Source: Home / Self Care | Attending: Family Medicine | Admitting: Family Medicine

## 2017-07-26 ENCOUNTER — Encounter: Payer: Self-pay | Admitting: *Deleted

## 2017-07-26 ENCOUNTER — Other Ambulatory Visit: Payer: Self-pay

## 2017-07-26 DIAGNOSIS — R0789 Other chest pain: Secondary | ICD-10-CM | POA: Diagnosis not present

## 2017-07-26 DIAGNOSIS — R202 Paresthesia of skin: Secondary | ICD-10-CM

## 2017-07-26 NOTE — ED Triage Notes (Signed)
Patient c/o awakening last night with left arm numbness that lasted about 15 minutes. Today c/o chest tightness. Denies any other associated symptoms. H/o anxiety. Off of Lexapro x 2 months.

## 2017-07-26 NOTE — ED Provider Notes (Signed)
[facility]    CSN: [id number] Arrival date & time: 07/26/17  1625     History   Chief Complaint Chief Complaint  Patient presents with  . Chest Pain    HPI Wyatt Alexander is a 21 y.o. male.   Patient awoke today with numbness in his left arm that lasted about 15 minutes before resolving.  Today he has experienced vague tightness in his anterior chest that has now resolved.  No shortness of breath.  He feels well presently.   The history is provided by the patient.  Chest Pain  Pain location:  Substernal area Pain quality: aching   Pain radiates to:  Does not radiate Pain severity:  Mild Onset quality:  Sudden Duration:  1 hour Timing:  Constant Progression:  Resolved Chronicity:  New Context: movement   Relieved by:  None tried Worsened by:  Movement Ineffective treatments:  None tried Associated symptoms: numbness   Associated symptoms: no abdominal pain, no AICD problem, no anorexia, no cough, no diaphoresis, no dizziness, no dysphagia, no fatigue, no fever, no heartburn, no nausea, no palpitations, no PND, no shortness of breath, no syncope and no vomiting     Past Medical History:  Diagnosis Date  . Allergy   . Essential tremor     Patient Active Problem List   Diagnosis Date Noted  . Sprain of unspecified ligament of right ankle, initial encounter 09/26/2016  . Essential tremor   . Allergy   . Concussion 03/24/2012    Past Surgical History:  Procedure Laterality Date  . WRIST SURGERY         Home Medications    Prior to Admission medications   Not on File    Family History Family History  Problem Relation Age of Onset  . Hypertension Father   . Heart attack Other     Social History Social History  Substance Use Topics  . Smoking status: Current Every Day Smoker  . Smokeless tobacco: Never Used  . Alcohol use No     Allergies   Patient has no known allergies.   Review of Systems Review of Systems    Constitutional: Negative for diaphoresis, fatigue and fever.  HENT: Negative for trouble swallowing.   Respiratory: Negative for cough and shortness of breath.   Cardiovascular: Positive for chest pain. Negative for palpitations, syncope and PND.  Gastrointestinal: Negative for abdominal pain, anorexia, heartburn, nausea and vomiting.  Neurological: Positive for numbness. Negative for dizziness.  All other systems reviewed and are negative.    Physical Exam Triage Vital Signs ED Triage Vitals  Enc Vitals Group     BP 07/26/17 1643 (!) 152/82     Pulse Rate 07/26/17 1643 (!) 51     Resp 07/26/17 1643 16     Temp 07/26/17 1643 (!) 97.4 F (36.3 C)     Temp Source 07/26/17 1643 Oral     SpO2 07/26/17 1643 100 %     Weight 07/26/17 1644 163 lb (73.9 kg)     Height 07/26/17 1644  (1.88 m)     Head Circumference --      Peak Flow --      Pain Score 07/26/17 1644 2     Pain Loc --      Pain Edu? --      Excl. in GC? --    No data found.   Updated Vital Signs BP (!) 152/82 (BP Location: Left Arm)   Pulse (!) 51  Temp (!) 97.4 F (36.3 C) (Oral)   Resp 16   Ht  (1.88 m)   Wt 163 lb (73.9 kg)   SpO2 100%   BMI 20.93 kg/m   Visual Acuity Right Eye Distance:   Left Eye Distance:   Bilateral Distance:    Right Eye Near:   Left Eye Near:    Bilateral Near:     Physical Exam Nursing notes and Vital Signs reviewed. Appearance:  Patient appears stated age, and in no acute distress Eyes:  Pupils are equal, round, and reactive to light and accomodation.  Extraocular movement is intact.  Conjunctivae are not inflamed  Ears:  Canals normal.  Tympanic membranes normal.  Nose:  Normal turbinates.  No sinus tenderness.     Pharynx:  Normal Neck:  Supple.  No adenopathy.  Lungs:  Clear to auscultation.  Breath sounds are equal.  Moving air well. Chest:  No tenderness to palpation  Heart:  Regular rate and rhythm without murmurs, rubs, or gallops.  Abdomen:   Nontender without masses or hepatosplenomegaly.  Bowel sounds are present.  No CVA or flank tenderness.  Extremities:  No edema.  Skin:  No rash present. Neurologic:  Distal sensation and strength intact.    UC Treatments / Results  Labs (all labs ordered are listed, but only abnormal results are displayed) Labs Reviewed - No data to display  EKG  EKG Interpretation  Rate:  49 BPM PR:  168 msec QT:  414 msec QTcH:  373 msec QRSD:  106 msec QRS axis:  75 degrees Interpretation:  Sinus bradycardia, otherwise within normal limits        Radiology No results found.  Procedures Procedures (including critical care time)  Medications Ordered in UC Medications - No data to display   Initial Impression / Assessment and Plan / UC Course  I have reviewed the triage vital signs and the nursing notes.  Pertinent labs & imaging results that were available during my care of the patient were reviewed by me and considered in my medical decision making (see chart for details).    Normal EKG reassuring. Suspect positional paresthesia left arm, resolved. Followup with Family Doctor if not improved if symptoms recur or persist.   Final Clinical Impressions(s) / UC Diagnoses   Final diagnoses:  Musculoskeletal chest pain  Arm paresthesia, left    New Prescriptions New Prescriptions   No medications on file         Lattie Haw, MD 07/29/17 0007

## 2017-09-13 ENCOUNTER — Emergency Department (HOSPITAL_BASED_OUTPATIENT_CLINIC_OR_DEPARTMENT_OTHER): Payer: Worker's Compensation | Admitting: Anesthesiology

## 2017-09-13 ENCOUNTER — Encounter (HOSPITAL_BASED_OUTPATIENT_CLINIC_OR_DEPARTMENT_OTHER): Payer: Self-pay | Admitting: *Deleted

## 2017-09-13 ENCOUNTER — Emergency Department (HOSPITAL_BASED_OUTPATIENT_CLINIC_OR_DEPARTMENT_OTHER)
Admission: EM | Admit: 2017-09-13 | Discharge: 2017-09-13 | Disposition: A | Discharge status: Other Acute Inpatient Hospital | Payer: Worker's Compensation | Attending: Emergency Medicine | Admitting: Emergency Medicine

## 2017-09-13 ENCOUNTER — Ambulatory Visit (HOSPITAL_BASED_OUTPATIENT_CLINIC_OR_DEPARTMENT_OTHER): Admit: 2017-09-13 | Payer: BLUE CROSS/BLUE SHIELD | Admitting: Orthopedic Surgery

## 2017-09-13 ENCOUNTER — Other Ambulatory Visit: Payer: Self-pay

## 2017-09-13 ENCOUNTER — Encounter (HOSPITAL_BASED_OUTPATIENT_CLINIC_OR_DEPARTMENT_OTHER)
Admission: EM | Disposition: A | Discharge status: Other Acute Inpatient Hospital | Payer: Self-pay | Source: Home / Self Care | Attending: Emergency Medicine

## 2017-09-13 ENCOUNTER — Emergency Department (HOSPITAL_BASED_OUTPATIENT_CLINIC_OR_DEPARTMENT_OTHER): Payer: Worker's Compensation

## 2017-09-13 DIAGNOSIS — S6992XA Unspecified injury of left wrist, hand and finger(s), initial encounter: Secondary | ICD-10-CM

## 2017-09-13 DIAGNOSIS — W240XXA Contact with lifting devices, not elsewhere classified, initial encounter: Secondary | ICD-10-CM | POA: Diagnosis not present

## 2017-09-13 DIAGNOSIS — S62630B Displaced fracture of distal phalanx of right index finger, initial encounter for open fracture: Secondary | ICD-10-CM | POA: Insufficient documentation

## 2017-09-13 DIAGNOSIS — S67191A Crushing injury of left index finger, initial encounter: Secondary | ICD-10-CM | POA: Diagnosis not present

## 2017-09-13 DIAGNOSIS — Y990 Civilian activity done for income or pay: Secondary | ICD-10-CM | POA: Diagnosis not present

## 2017-09-13 DIAGNOSIS — F329 Major depressive disorder, single episode, unspecified: Secondary | ICD-10-CM | POA: Diagnosis not present

## 2017-09-13 DIAGNOSIS — Z23 Encounter for immunization: Secondary | ICD-10-CM | POA: Insufficient documentation

## 2017-09-13 DIAGNOSIS — S61211A Laceration without foreign body of left index finger without damage to nail, initial encounter: Secondary | ICD-10-CM

## 2017-09-13 HISTORY — PX: MINOR NAILBED REPAIR: SHX5979

## 2017-09-13 HISTORY — DX: Major depressive disorder, single episode, unspecified: F32.9

## 2017-09-13 HISTORY — DX: Depression, unspecified: F32.A

## 2017-09-13 HISTORY — DX: Panic disorder (episodic paroxysmal anxiety): F41.0

## 2017-09-13 SURGERY — MINOR NAILBED REPAIR
Anesthesia: Monitor Anesthesia Care | Site: Finger | Laterality: Left

## 2017-09-13 MED ORDER — LIDOCAINE HCL (PF) 1 % IJ SOLN: 5.0000 mL | Freq: Once | INTRAMUSCULAR | Status: DC

## 2017-09-13 MED ORDER — PROPOFOL 10 MG/ML IV BOLUS
INTRAVENOUS | Status: AC
  Filled 2017-09-13: qty 20

## 2017-09-13 MED ORDER — 0.9 % SODIUM CHLORIDE (POUR BTL) OPTIME
TOPICAL | Status: DC | PRN
  Administered 2017-09-13: 500 mL

## 2017-09-13 MED ORDER — SCOPOLAMINE 1 MG/3DAYS TD PT72: 1.0000 | MEDICATED_PATCH | Freq: Once | TRANSDERMAL | Status: DC | PRN

## 2017-09-13 MED ORDER — LIDOCAINE HCL 1 % IJ SOLN
INTRAMUSCULAR | Status: AC
  Administered 2017-09-13: 20 mL
  Filled 2017-09-13: qty 20

## 2017-09-13 MED ORDER — SULFAMETHOXAZOLE-TRIMETHOPRIM 800-160 MG PO TABS: 1.0000 | ORAL_TABLET | Freq: Two times a day (BID) | ORAL | 0 refills | Status: DC

## 2017-09-13 MED ORDER — TETANUS-DIPHTH-ACELL PERTUSSIS 5-2.5-18.5 LF-MCG/0.5 IM SUSP
0.5000 mL | Freq: Once | INTRAMUSCULAR | Status: AC
  Administered 2017-09-13: 0.5 mL via INTRAMUSCULAR
  Filled 2017-09-13: qty 0.5

## 2017-09-13 MED ORDER — POVIDONE-IODINE 10 % EX SWAB: 2.0000 "application " | Freq: Once | CUTANEOUS | Status: DC

## 2017-09-13 MED ORDER — CEFAZOLIN SODIUM-DEXTROSE 2-4 GM/100ML-% IV SOLN
INTRAVENOUS | Status: AC
  Filled 2017-09-13: qty 100

## 2017-09-13 MED ORDER — MIDAZOLAM HCL 2 MG/2ML IJ SOLN
INTRAMUSCULAR | Status: AC
  Filled 2017-09-13: qty 2

## 2017-09-13 MED ORDER — LIDOCAINE HCL 2 % IJ SOLN
INTRAMUSCULAR | Status: AC
  Filled 2017-09-13: qty 20

## 2017-09-13 MED ORDER — LIDOCAINE HCL (PF) 1 % IJ SOLN
INTRAMUSCULAR | Status: AC
  Filled 2017-09-13: qty 30

## 2017-09-13 MED ORDER — HYDROCODONE-ACETAMINOPHEN 5-325 MG PO TABS: ORAL_TABLET | ORAL | 0 refills | Status: DC

## 2017-09-13 MED ORDER — FENTANYL CITRATE (PF) 100 MCG/2ML IJ SOLN
INTRAMUSCULAR | Status: AC
  Filled 2017-09-13: qty 2

## 2017-09-13 MED ORDER — LIDOCAINE HCL 2 % IJ SOLN
INTRAMUSCULAR | Status: DC | PRN
  Administered 2017-09-13: 5 mL

## 2017-09-13 MED ORDER — FENTANYL CITRATE (PF) 100 MCG/2ML IJ SOLN: 50.0000 ug | INTRAMUSCULAR | Status: DC | PRN

## 2017-09-13 MED ORDER — BUPIVACAINE HCL (PF) 0.25 % IJ SOLN
INTRAMUSCULAR | Status: AC
  Filled 2017-09-13: qty 30

## 2017-09-13 MED ORDER — DEXAMETHASONE SODIUM PHOSPHATE 10 MG/ML IJ SOLN
INTRAMUSCULAR | Status: AC
  Filled 2017-09-13: qty 1

## 2017-09-13 MED ORDER — LACTATED RINGERS IV SOLN
INTRAVENOUS | Status: DC
  Administered 2017-09-13: 14:00:00 via INTRAVENOUS

## 2017-09-13 MED ORDER — CEFAZOLIN SODIUM-DEXTROSE 2-4 GM/100ML-% IV SOLN
2.0000 g | INTRAVENOUS | Status: AC
  Administered 2017-09-13: 2 g via INTRAVENOUS

## 2017-09-13 MED ORDER — CHLORHEXIDINE GLUCONATE 4 % EX LIQD: 60.0000 mL | Freq: Once | CUTANEOUS | Status: DC

## 2017-09-13 MED ORDER — MIDAZOLAM HCL 2 MG/2ML IJ SOLN: 1.0000 mg | INTRAMUSCULAR | Status: DC | PRN

## 2017-09-13 MED ORDER — BUPIVACAINE HCL (PF) 0.25 % IJ SOLN
INTRAMUSCULAR | Status: DC | PRN
  Administered 2017-09-13: 5 mL

## 2017-09-13 MED ORDER — ONDANSETRON HCL 4 MG/2ML IJ SOLN
INTRAMUSCULAR | Status: AC
  Filled 2017-09-13: qty 6

## 2017-09-13 SURGICAL SUPPLY — 57 items
BAG DECANTER FOR FLEXI CONT (MISCELLANEOUS) IMPLANT
BANDAGE ACE 3X5.8 VEL STRL LF (GAUZE/BANDAGES/DRESSINGS) IMPLANT
BLADE MINI RND TIP GREEN BEAV (BLADE) IMPLANT
BLADE SURG 15 STRL LF DISP TIS (BLADE) ×4 IMPLANT
BLADE SURG 15 STRL SS (BLADE) ×6
BNDG CMPR 9X4 STRL LF SNTH (GAUZE/BANDAGES/DRESSINGS)
BNDG COHESIVE 1X5 TAN STRL LF (GAUZE/BANDAGES/DRESSINGS) ×2 IMPLANT
BNDG ELASTIC 2X5.8 VLCR STR LF (GAUZE/BANDAGES/DRESSINGS) IMPLANT
BNDG ESMARK 4X9 LF (GAUZE/BANDAGES/DRESSINGS) IMPLANT
BNDG GAUZE 1X2.1 STRL (MISCELLANEOUS) IMPLANT
BNDG GAUZE ELAST 4 BULKY (GAUZE/BANDAGES/DRESSINGS) IMPLANT
BRUSH SCRUB EZ PLAIN DRY (MISCELLANEOUS) ×2 IMPLANT
CHLORAPREP W/TINT 26ML (MISCELLANEOUS) ×1 IMPLANT
CORD BIPOLAR FORCEPS 12FT (ELECTRODE) ×3 IMPLANT
COVER BACK TABLE 60X90IN (DRAPES) ×3 IMPLANT
COVER MAYO STAND STRL (DRAPES) ×3 IMPLANT
CUFF TOURNIQUET SINGLE 18IN (TOURNIQUET CUFF) ×3 IMPLANT
DRAPE EXTREMITY T 121X128X90 (DRAPE) ×3 IMPLANT
DRAPE SURG 17X23 STRL (DRAPES) ×3 IMPLANT
GAUZE PACKING IODOFORM 1/4X15 (GAUZE/BANDAGES/DRESSINGS) IMPLANT
GAUZE SPONGE 4X4 12PLY STRL (GAUZE/BANDAGES/DRESSINGS) ×3 IMPLANT
GAUZE XEROFORM 1X8 LF (GAUZE/BANDAGES/DRESSINGS) ×3 IMPLANT
GLOVE BIO SURGEON STRL SZ7.5 (GLOVE) ×3 IMPLANT
GLOVE BIOGEL M STRL SZ7.5 (GLOVE) ×2 IMPLANT
GLOVE BIOGEL PI IND STRL 8 (GLOVE) ×3 IMPLANT
GLOVE BIOGEL PI INDICATOR 8 (GLOVE) ×2
GOWN STRL REUS W/ TWL LRG LVL3 (GOWN DISPOSABLE) ×1 IMPLANT
GOWN STRL REUS W/ TWL XL LVL3 (GOWN DISPOSABLE) ×1 IMPLANT
GOWN STRL REUS W/TWL LRG LVL3 (GOWN DISPOSABLE)
GOWN STRL REUS W/TWL XL LVL3 (GOWN DISPOSABLE) ×6 IMPLANT
LOOP VESSEL MAXI BLUE (MISCELLANEOUS) IMPLANT
NDL BLD DRAW 23GX1  MC LAB (NEEDLE) IMPLANT
NDL HYPO 25X1 1.5 SAFETY (NEEDLE) IMPLANT
NEEDLE BLD DRAW 23GX1   MC LAB (NEEDLE) ×1
NEEDLE BLD DRAW 23GX1  MC LAB (NEEDLE) ×2 IMPLANT
NEEDLE HYPO 25X1 1.5 SAFETY (NEEDLE) ×3 IMPLANT
NS IRRIG 1000ML POUR BTL (IV SOLUTION) ×3 IMPLANT
PACK BASIN DAY SURGERY FS (CUSTOM PROCEDURE TRAY) ×3 IMPLANT
PAD CAST 3X4 CTTN HI CHSV (CAST SUPPLIES) IMPLANT
PADDING CAST ABS 4INX4YD NS (CAST SUPPLIES) ×1
PADDING CAST ABS COTTON 4X4 ST (CAST SUPPLIES) ×2 IMPLANT
PADDING CAST COTTON 3X4 STRL (CAST SUPPLIES)
SPLINT PLASTER CAST XFAST 3X15 (CAST SUPPLIES) IMPLANT
SPLINT PLASTER XTRA FASTSET 3X (CAST SUPPLIES)
STOCKINETTE 4X48 STRL (DRAPES) ×3 IMPLANT
SUT CHROMIC 6 0 PS 4 (SUTURE) ×2 IMPLANT
SUT ETHILON 4 0 PS 2 18 (SUTURE) IMPLANT
SUT MON AB 5-0 P3 18 (SUTURE) ×2 IMPLANT
SWAB COLLECTION DEVICE MRSA (MISCELLANEOUS) IMPLANT
SWAB CULTURE ESWAB REG 1ML (MISCELLANEOUS) IMPLANT
SYR 20CC LL (SYRINGE) IMPLANT
SYR BULB 3OZ (MISCELLANEOUS) ×3 IMPLANT
SYR CONTROL 10ML LL (SYRINGE) ×2 IMPLANT
TOWEL OR 17X24 6PK STRL BLUE (TOWEL DISPOSABLE) ×6 IMPLANT
TRAY DSU PREP LF (CUSTOM PROCEDURE TRAY) ×2 IMPLANT
TUBE FEEDING ENTERAL 5FR 16IN (TUBING) IMPLANT
UNDERPAD 30X30 (UNDERPADS AND DIAPERS) ×3 IMPLANT

## 2017-09-13 NOTE — ED Notes (Signed)
RN Neysa Bonitohristy canceled UDS Due to Pt  Needing to head to day surgery.

## 2017-09-13 NOTE — ED Triage Notes (Signed)
[facility] -- reports around 0645 he was at work and cough his L forefinger caught between a lift and shelf. Reports some numbness to finger -- finger wrapped at this time. Reports he works for a [employer] agency and will get in touch with them to ask about [facility] paperwork and whether drug screen is needed.

## 2017-09-13 NOTE — ED Provider Notes (Signed)
[facility] Provider Note   CSN: [id number] Arrival date & time: 09/13/17  0740     History   Chief Complaint Chief Complaint  Patient presents with  . Finger Injury    HPI Wyatt Alexander is a 21 y.o. male.  HPI  21yo left handed male presents with concern for left index finger crush injury while at work today.  Occurred just prior to arrival. Reports index finger was caught between a lift and shelf.  Had bleeding at the scene, finger was wrapped with improvement.  Pain is throbbing, severe, worse with movement and palpation.  Feels some numbness to tip of finger on history initially but denies now.  No other injuries  Past Medical History:  Diagnosis Date  . Allergy   . Depression   . Essential tremor   . Panic attacks     Patient Active Problem List   Diagnosis Date Noted  . Sprain of unspecified ligament of right ankle, initial encounter 09/26/2016  . Essential tremor   . Allergy   . Concussion 03/24/2012    Past Surgical History:  Procedure Laterality Date  . WRIST SURGERY         Home Medications    Prior to Admission medications   Medication Sig Start Date End Date Taking? Authorizing Provider  ALPRAZolam Prudy Feeler) 0.5 MG tablet Take 0.5 mg as needed by mouth for anxiety.   Yes [provider]  escitalopram (LEXAPRO) 10 MG tablet Take 10 mg daily by mouth.    [provider]  HYDROcodone-acetaminophen (NORCO) 5-325 MG tablet 1-2 tabs po q6 hours prn pain 09/13/17   Betha Loa, MD  sulfamethoxazole-trimethoprim (BACTRIM DS) 800-160 MG tablet Take 1 tablet 2 (two) times daily by mouth. 09/13/17   Betha Loa, MD    Family History Family History  Problem Relation Age of Onset  . Hypertension Father   . Heart attack Other     Social History Social History   Tobacco Use  . Smoking status: Never Smoker  . Smokeless tobacco: Never Used  Substance Use Topics  . Alcohol use: No  . Drug use: Yes    Types: Marijuana      Allergies   Patient has no known allergies.   Review of Systems Review of Systems  Constitutional: Negative for fever.  Eyes: Negative for visual disturbance.  Respiratory: Negative for shortness of breath.   Genitourinary: Negative for difficulty urinating.  Musculoskeletal: Positive for arthralgias. Negative for neck stiffness.  Skin: Positive for wound. Negative for rash.  Neurological: Negative for syncope and headaches.     Physical Exam Updated Vital Signs BP 138/81   Pulse 62   Temp 98.8 F (37.1 C) (Oral)   Resp 18   Ht 6\' 2"  (1.88 m)   Wt 76.4 kg (168 lb 6 oz)   SpO2 100%   BMI 21.62 kg/m   Physical Exam  Constitutional: He is oriented to person, place, and time. He appears well-developed and well-nourished. No distress.  HENT:  Head: Normocephalic and atraumatic.  Eyes: Conjunctivae and EOM are normal.  Neck: Normal range of motion.  Cardiovascular: Normal rate, regular rhythm, normal heart sounds and intact distal pulses. Exam reveals no gallop and no friction rub.  No murmur heard. Pulmonary/Chest: Effort normal and breath sounds normal. No respiratory distress. He has no wheezes. He has no rales.  Musculoskeletal: He exhibits no edema.  See photo. Left index finger with laceration, nail plate initially lifted and protruding through  laceration, subungual hematoma Normal sensation Tenderness distal finger Normal flexion/extension  Neurological: He is alert and oriented to person, place, and time.  Skin: Skin is warm and dry. He is not diaphoretic.  Laceration oer dorsum of index finger at proximal portion of nail plate with protrusion of nail, and extension of laceration onto palmar side of finger 2cm  Nursing note and vitals reviewed.        ED Treatments / Results  Labs (all labs ordered are listed, but only abnormal results are displayed) Labs Reviewed - No data to display  EKG  EKG Interpretation None       Radiology Dg Finger  Index Left  Result Date: 09/13/2017 CLINICAL DATA:  21 year old male with left index finger trauma. Note is made of overlying laceration. EXAM: LEFT INDEX FINGER 2+V COMPARISON:  None. FINDINGS: There is a fracture at the tuft of the distal phalanx with mild volar angulation of the distal fracture fragment. No additional fractures noted. Marked overlying soft tissue swelling and irregularity. IMPRESSION: Fracture at the tuft of the distal phalanx with mild volar angulation of the distal fracture fragment. Overlying soft tissue swelling and irregularity. Electronically Signed   By: Sande BrothersSerena  Chacko M.D.   On: 09/13/2017 08:34    Procedures .Nerve Block Date/Time: 09/14/2017 11:06 AM Performed by: Alvira MondaySchlossman, Karem Tomaso, MD Authorized by: Alvira MondaySchlossman, Irven Ingalsbe, MD   Consent:    Consent obtained:  Verbal   Risks discussed:  Infection, allergic reaction, bleeding, swelling, unsuccessful block and pain Indications:    Indications:  Pain relief Location:    Body area:  Upper extremity   Upper extremity nerve blocked: digital.   Laterality:  Left Pre-procedure details:    Skin preparation:  2% chlorhexidine   Preparation: Patient was prepped and draped in usual sterile fashion   Procedure details (see MAR for exact dosages):    Anesthetic injected:  Lidocaine 1% w/o epi   Injection procedure:  Anatomic landmarks identified Post-procedure details:    Patient tolerance of procedure:  Tolerated well, no immediate complications   (including critical care time)  Medications Ordered in ED Medications  Tdap (BOOSTRIX) injection 0.5 mL (0.5 mLs Intramuscular Given 09/13/17 0814)  lidocaine (XYLOCAINE) 1 % (with pres) injection (20 mLs  Given by Other 09/13/17 0818)  ceFAZolin (ANCEF) IVPB 2g/100 mL premix (0 g Intravenous Stopped 09/13/17 1718)     Initial Impression / Assessment and Plan / ED Course  I have reviewed the triage vital signs and the nursing notes.  Pertinent labs & imaging results that were  available during my care of the patient were reviewed by me and considered in my medical decision making (see chart for details).     21yo left handed male presents with concern for left index finger crush injury while at work today.  TDap updated.  XR shows tuft fracture.  Digital block performed for full physical exam, evaluation and pain relief, nail plate protrusion reduced. Discussed with Dr. Merlyn LotKuzma who recommends transfer to Day Surgery for operative care, irrigation, repair of nailbed injury.   Final Clinical Impressions(s) / ED Diagnoses   Final diagnoses:  Laceration of left index finger without foreign body without damage to nail, initial encounter  Injury of nail bed of finger of left hand, initial encounter    ED Discharge Orders        Ordered    HYDROcodone-acetaminophen (NORCO) 5-325 MG tablet     09/13/17 1728    sulfamethoxazole-trimethoprim (BACTRIM DS) 800-160 MG tablet  2  times daily     09/13/17 1728       Alvira MondaySchlossman, Lacresha Fusilier, MD 09/14/17 1115

## 2017-09-13 NOTE — H&P (Signed)
  Wyatt MalletDaniel Alexander is an 21 y.o. male.   Chief Complaint: left index finger crush HPI: 21 yo lhd male states while at work this morning his left index finger was smashed between machine and wall.  [facility] and [facility] for further care.  He reports no previous injury to left index finger and no other injury at this time.  Throbbing pain of 7/10 severity.  Alleviated and aggravated by nothing.  Associated bleeding.  Case discussed with Alvira MondayErin Schlossman, MD and her note from 09/13/2017 reviewed. Xrays viewed and interpreted by me: three views left index finger show tuft fracture with displacement. Labs reviewed: none  Allergies: No Known Allergies  Past Medical History:  Diagnosis Date  . Allergy   . Depression   . Essential tremor   . Panic attacks     Past Surgical History:  Procedure Laterality Date  . WRIST SURGERY      Family History: Family History  Problem Relation Age of Onset  . Hypertension Father   . Heart attack Other     Social History:   reports that  has never smoked. he has never used smokeless tobacco. He reports that he uses drugs. Drug: Marijuana. He reports that he does not drink alcohol.  Medications: Medications Prior to Admission  Medication Sig Dispense Refill  . ALPRAZolam (XANAX) 0.5 MG tablet Take 0.5 mg as needed by mouth for anxiety.    Marland Kitchen. escitalopram (LEXAPRO) 10 MG tablet Take 10 mg daily by mouth.      No results found for this or any previous visit (from the past 48 hour(s)).  Dg Finger Index Left  Result Date: 09/13/2017 CLINICAL DATA:  21 year old male with left index finger trauma. Note is made of overlying laceration. EXAM: LEFT INDEX FINGER 2+V COMPARISON:  None. FINDINGS: There is a fracture at the tuft of the distal phalanx with mild volar angulation of the distal fracture fragment. No additional fractures noted. Marked overlying soft tissue swelling and irregularity. IMPRESSION: Fracture at the tuft of the distal phalanx with mild volar  angulation of the distal fracture fragment. Overlying soft tissue swelling and irregularity. Electronically Signed   By: Sande BrothersSerena  Chacko M.D.   On: 09/13/2017 08:34     A comprehensive review of systems was negative. Review of Systems: No fevers, chills, night sweats, chest pain, shortness of breath, nausea, vomiting, diarrhea, constipation, easy bleeding or bruising, headaches, dizziness, vision changes, fainting.   Blood pressure 133/85, pulse 66, temperature 98.4 F (36.9 C), temperature source Oral, resp. rate 18, height 6\' 2"  (1.88 m), weight 77.1 kg (170 lb), SpO2 99 %.  General appearance: alert, cooperative and appears stated age Head: Normocephalic, without obvious abnormality, atraumatic Neck: supple, symmetrical, trachea midline Resp: clear to auscultation bilaterally Cardio: regular rate and rhythm GI: non-tender Extremities: Intact sensation and capillary refill all digits.  +epl/fpl/io.  Left index finger with nail avulsion and laceration at radial side.  Intact sensation and capillary refill. Pulses: 2+ and symmetric Skin: Skin color, texture, turgor normal. No rashes or lesions Neurologic: Grossly normal Incision/Wound: As above  Assessment/Plan Left index finger crush injury with open distal phalanx fracture and nail injury.  Recommend OR for irrigation and debridement and repair skin and nail bed.  Risks, benefits, and alternatives of surgery were discussed and the patient agrees with the plan of care.   Delane Stalling R 09/13/2017, 1:21 PM

## 2017-09-13 NOTE — ED Notes (Signed)
Patient transported to X-ray 

## 2017-09-13 NOTE — Op Note (Signed)
[id number] 

## 2017-09-13 NOTE — ED Notes (Signed)
ED Provider at bedside. 

## 2017-09-13 NOTE — Brief Op Note (Signed)
09/13/2017  5:23 PM  PATIENT:  Wyatt Alexander  21 y.o. male  PRE-OPERATIVE DIAGNOSIS:  Crush Injury Left Index Finger  POST-OPERATIVE DIAGNOSIS:  Crush Injury Left Index Finger  PROCEDURE:  Procedure(s): INCISION AND DRAINAGE WITH REPAIR AS NECESSARY LEFT INDEX FINGER (Left)  SURGEON:  Surgeon(s) and Role:    Betha Loa* Abdelrahman Nair, MD - Primary  PHYSICIAN ASSISTANT:   ASSISTANTS: none   ANESTHESIA:   local  EBL:  minimal  BLOOD ADMINISTERED:none  DRAINS: none   LOCAL MEDICATIONS USED:  MARCAINE    and LIDOCAINE   SPECIMEN:  No Specimen  DISPOSITION OF SPECIMEN:  N/A  COUNTS:  YES  TOURNIQUET:   Total Tourniquet Time Documented: area (Left) - 15 minutes Total: area (Left) - 15 minutes   DICTATION: .Other Dictation: Dictation Number [id number]  PLAN OF CARE: Discharge to home after PACU  PATIENT DISPOSITION:  PACU - hemodynamically stable.

## 2017-09-13 NOTE — Discharge Instructions (Signed)

## 2017-09-13 NOTE — ED Notes (Signed)
Spoke with Wyatt BlightKarema Alexander (pt present and gave verbal consent to discuss his care) -- 10-panel drug screen is needed. States she will look into whether other tests/paperwork is needed and call pt back.

## 2017-09-13 NOTE — Progress Notes (Signed)
Patient and Patient's father [facility] [phone]) to obtain the proper urine drug screen.   [facility] per Dr. Betha LoaKevin Kuzma.

## 2017-09-13 NOTE — Anesthesia Preprocedure Evaluation (Deleted)
Anesthesia Evaluation  Patient identified by MRN, date of birth, ID band Patient awake    Reviewed: Allergy & Precautions, NPO status , Patient's Chart, lab work & pertinent test results  Airway Mallampati: II  TM Distance: >3 FB Neck ROM: Full    Dental  (+) Teeth Intact, Dental Advisory Given   Pulmonary neg pulmonary ROS,    Pulmonary exam normal breath sounds clear to auscultation       Cardiovascular negative cardio ROS Normal cardiovascular exam Rhythm:Regular Rate:Normal     Neuro/Psych PSYCHIATRIC DISORDERS Anxiety Depression Essential tremor  negative neurological ROS     GI/Hepatic negative GI ROS, Neg liver ROS,   Endo/Other  negative endocrine ROS  Renal/GU negative Renal ROS     Musculoskeletal negative musculoskeletal ROS (+)   Abdominal   Peds  Hematology negative hematology ROS (+)   Anesthesia Other Findings Day of surgery medications reviewed with the patient.  Reproductive/Obstetrics                             Anesthesia Physical Anesthesia Plan  ASA: II  Anesthesia Plan: MAC   Post-op Pain Management:    Induction: Intravenous  PONV Risk Score and Plan: 1 and Propofol infusion and Midazolam  Airway Management Planned: Nasal Cannula  Additional Equipment:   Intra-op Plan:   Post-operative Plan:   Informed Consent: I have reviewed the patients History and Physical, chart, labs and discussed the procedure including the risks, benefits and alternatives for the proposed anesthesia with the patient or authorized representative who has indicated his/her understanding and acceptance.   Dental advisory given  Plan Discussed with: CRNA and Anesthesiologist  Anesthesia Plan Comments: (Discussed risks/benefits/alternatives to MAC sedation including need for ventilatory support, hypotension, need for conversion to general anesthesia.  All patient questions  answered.  Patient/guardian wishes to proceed.  MAC plus local digital block)       Anesthesia Quick Evaluation

## 2017-09-14 ENCOUNTER — Encounter (HOSPITAL_BASED_OUTPATIENT_CLINIC_OR_DEPARTMENT_OTHER): Payer: Self-pay | Admitting: Orthopedic Surgery

## 2017-09-14 NOTE — Op Note (Signed)
NAME:  Tupy, Orrie                ACCOUNT NO.:  [id number]  MEDICAL RECORD NO.:  [id number]  LOCATION:                                 FACILITY:  PHYSICIAN:  Betha LoaKevin Saniah Schroeter, MD             DATE OF BIRTH:  DATE OF PROCEDURE:  09/13/2017 DATE OF DISCHARGE:                              OPERATIVE REPORT   PREOPERATIVE DIAGNOSES:  Left index fingertip crush injury with open distal phalanx fracture, skin and nailbed lacerations.  POSTOPERATIVE DIAGNOSES:  Left index fingertip crush injury with open distal phalanx fracture, skin and nailbed lacerations.  PROCEDURES:   1. Left index finger irrigation and debridement of open distal phalanx fracture 2. Left index finger open reduction of distal phalanx fracture 3. Left index finger repair of skin and nail bed laceration.  SURGEON:  Betha LoaKevin Barnie Sopko, MD.  ASSISTANT:  None.  ANESTHESIA:  Local with half and half solution 2% plain lidocaine, 0.25% plain Marcaine.  IV FLUIDS:  Per anesthesia flow sheet.  ESTIMATED BLOOD LOSS:  Minimal.  COMPLICATIONS:  None.  SPECIMENS:  None.  TOURNIQUET TIME:  15 minutes.  DISPOSITION:  Stable to PACU.  INDICATIONS:  Mr. Lorrene ReidCombs is a 21 year old male, who states that this morning he smashed his finger while at work.  [facility] were [facility] revealing distal phalanx tuft fracture.  He was referred to me for further care.  I recommended irrigation and debridement of the open fracture with reduction of the fracture and repair of skin and nail bed lacerations in the operating room under digital block.  Risks, benefits, and alternatives of surgery were discussed including risks of blood loss, infection, damage to nerves, vessels, tendons, ligaments, and bone, failure of surgery, need for additional surgery, complications with wound healing, continued pain, nonunion, malunion, stiffness, and nail deformity.  He voiced understanding of these risks and elected to  proceed.  OPERATIVE COURSE:  After being identified preoperatively by myself, the patient and I agreed upon the procedure and site of procedure.  Surgical site was marked.  The risks, benefits, and alternatives of surgery were reviewed and he wished to proceed.  Surgical consent had been signed. He was given IV Ancef as preoperative antibiotic prophylaxis and for the open fracture.  He was transferred to the operating room and placed on the operating room table in supine position with the left upper extremity on an arm board.  Surgical pause was performed between surgeons, operating staff, and the patient; all were in agreement as to the patient, procedure, and site of procedure.  A digital block to the left index finger was performed with 10 mL of half and half solution of 2% plain lidocaine and 0.25% plain Marcaine.  This was adequate to give digital anesthesia to the finger.  The hand was prepped and draped in a normal sterile orthopedic fashion.  A surgical pause was again performed between surgeons, operating staff, and the patient; all were in agreement as to the patient, procedure, and site of procedure.  A Penrose drain was used at the proximal aspect of the fingers as a tourniquet and it was  up for approximately 15 minutes.  The nail was removed with a Therapist, nutritionalreer elevator.  There was a transverse laceration with some stellate component to it through the nail bed.  The distal phalanx was exposed.  The laceration coursed toward the radial side.  The neurovascular structures appeared intact.  There was hematoma within the wound.  This was removed.  The wound was copiously irrigated with sterile saline by bulb syringe.  The hematoma was removed with the pickups.  The distal phalanx fracture was reduced under direct visualization.  The skin was repaired with 5-0 Monocryl suture in an interrupted fashion.  The nail bed was repaired with a 6-0 chromic suture in an interrupted fashion.  This  provided good reapposition of all soft tissues.  There was good contour to the finger.  A piece of Xeroform was placed in nail fold and the wound was dressed with sterile Xeroform, 4 x 4's and wrapped with a Coban dressing lightly.  An Alumafoam splint was placed and wrapped lightly with Coban dressing. The Penrose drain was removed at approximately 15 minutes.  He tolerated the procedure well.  The operative drapes were broken down.  He was taken to Recovery in stable condition.  I will see him back in the office in 1 week for postoperative followup.  I will give him Norco 5/325 one to two p.o. q.6 hours p.r.n. pain, dispensed #30, and Bactrim DS 1 p.o. b.i.d. x7 days.     Betha LoaKevin Ladonte Verstraete, MD     KK/MEDQ  D:  09/13/2017  T:  09/14/2017  Job:  [id number]

## 2018-04-10 ENCOUNTER — Emergency Department (HOSPITAL_COMMUNITY): Payer: BLUE CROSS/BLUE SHIELD

## 2018-04-10 ENCOUNTER — Emergency Department (HOSPITAL_COMMUNITY)
Admission: EM | Admit: 2018-04-10 | Discharge: 2018-04-10 | Disposition: A | Discharge status: Home / Self Care | Payer: BLUE CROSS/BLUE SHIELD | Attending: Emergency Medicine | Admitting: Emergency Medicine

## 2018-04-10 ENCOUNTER — Encounter (HOSPITAL_COMMUNITY): Payer: Self-pay | Admitting: Emergency Medicine

## 2018-04-10 DIAGNOSIS — R079 Chest pain, unspecified: Secondary | ICD-10-CM | POA: Diagnosis present

## 2018-04-10 DIAGNOSIS — Z79899 Other long term (current) drug therapy: Secondary | ICD-10-CM | POA: Diagnosis not present

## 2018-04-10 DIAGNOSIS — R0789 Other chest pain: Secondary | ICD-10-CM | POA: Diagnosis not present

## 2018-04-10 LAB — CBC
HCT: 40.5 % (ref 39.0–52.0)
HEMOGLOBIN: 13.9 g/dL (ref 13.0–17.0)
MCH: 30.7 pg (ref 26.0–34.0)
MCHC: 34.3 g/dL (ref 30.0–36.0)
MCV: 89.4 fL (ref 78.0–100.0)
PLATELETS: 183 10*3/uL (ref 150–400)
RBC: 4.53 MIL/uL (ref 4.22–5.81)
RDW: 12.3 % (ref 11.5–15.5)
WBC: 7 10*3/uL (ref 4.0–10.5)

## 2018-04-10 LAB — I-STAT TROPONIN, ED
TROPONIN I, POC: 0 ng/mL (ref 0.00–0.08)
Troponin i, poc: 0.01 ng/mL (ref 0.00–0.08)

## 2018-04-10 LAB — BASIC METABOLIC PANEL
ANION GAP: 8 (ref 5–15)
BUN: 12 mg/dL (ref 6–20)
CALCIUM: 9 mg/dL (ref 8.9–10.3)
CO2: 28 mmol/L (ref 22–32)
Chloride: 103 mmol/L (ref 101–111)
Creatinine, Ser: 1.01 mg/dL (ref 0.61–1.24)
Glucose, Bld: 106 mg/dL — ABNORMAL HIGH (ref 65–99)
Potassium: 3.3 mmol/L — ABNORMAL LOW (ref 3.5–5.1)
SODIUM: 139 mmol/L (ref 135–145)

## 2018-04-10 MED ORDER — POTASSIUM CHLORIDE CRYS ER 20 MEQ PO TBCR
40.0000 meq | EXTENDED_RELEASE_TABLET | Freq: Once | ORAL | Status: AC
  Administered 2018-04-10: 40 meq via ORAL
  Filled 2018-04-10 (×2): qty 2

## 2018-04-10 MED ORDER — GI COCKTAIL ~~LOC~~
30.0000 mL | Freq: Once | ORAL | Status: AC
  Administered 2018-04-10: 30 mL via ORAL
  Filled 2018-04-10: qty 30

## 2018-04-10 MED ORDER — OMEPRAZOLE 20 MG PO CPDR: 20.0000 mg | DELAYED_RELEASE_CAPSULE | Freq: Every day | ORAL | 0 refills | Status: DC

## 2018-04-10 MED ORDER — KETOROLAC TROMETHAMINE 30 MG/ML IJ SOLN
15.0000 mg | Freq: Once | INTRAMUSCULAR | Status: AC
  Administered 2018-04-10: 15 mg via INTRAMUSCULAR
  Filled 2018-04-10: qty 1

## 2018-04-10 NOTE — ED Notes (Signed)
PA at bedside.

## 2018-04-10 NOTE — Discharge Instructions (Addendum)
Your work-up has been reassuring today.  Unknown cause of your symptoms but may be related to acid reflux versus anxiety.  Take the medication that I prescribed for acid reflux.  It is very important for you to follow-up with your primary care doctor.  If you develop worsening symptoms return to the ED immediately.

## 2018-04-10 NOTE — ED Notes (Signed)
Called main pharmacy & spoke with Fayrene FearingJames & he advised he will send med

## 2018-04-10 NOTE — ED Notes (Signed)
No IV needed per PA

## 2018-04-10 NOTE — ED Notes (Signed)
Pt. alert & interactive during discharge; pt. ambulatory to exit with his mom

## 2018-04-10 NOTE — ED Triage Notes (Signed)
Patient reports intermittent left chest pain with exertional dyspnea , denies emesis or diaphoresis , rates pain " dull" 3/10 at triage , pt. also mentioned mild anxiety.

## 2018-04-10 NOTE — ED Notes (Signed)
Awaiting K-dur med from main pharmacy; message request sent

## 2018-04-10 NOTE — ED Provider Notes (Signed)
[facility] EMERGENCY [facility]PARTMENT Provider Note   CSN: [id number] Arrival date & time: 04/10/18  0004     History   Chief Complaint Chief Complaint  Patient presents with  . Chest Pain    HPI Wyatt Alexander is a 22 y.o. male.  HPI 22 year old male past medical history significant for depression and anxiety presents to the emergency department today complaining of vague chest pain.  Patient reports left and right-sided chest pain.  States that the pain has been intermittent over the past week.  No history of same.  Patient states that the pain is dull and burning in nature.  Patient rates the pain a 3/10 at triage.  The chest pain is not pleuritic or exertional in nature.  Patient reports associated shortness of breath.  States that he was researching things on the Internet and became very anxious what this could be and presented to the ED for evaluation.  Patient does have a history of significant anxiety and stress at this time.  Takes no medications.  Patient has not tried anything for symptoms prior to arrival.  Patient denies associated nausea, emesis or diaphoresis.  He feels that the pain is worse after eating.  States that while he was in the lobby he felt like he had acid in his throat.  Patient denies any cardiac history.  Denies any history of DVT/PE, prolonged immobilization, recent hospitalization/surgeries, unilateral leg swelling or calf tenderness, hemoptysis, hormone therapy.  Patient reports marijuana use but no tobacco use.   Pt denies any fever, chill, ha, vision changes, lightheadedness, dizziness, congestion, neck pain, cough, abd pain, n/v/d, urinary symptoms, change in bowel habits, melena, hematochezia, lower extremity paresthesias.  Past Medical History:  Diagnosis Date  . Allergy   . Depression   . Essential tremor   . Panic attacks     Patient Active Problem List   Diagnosis Date Noted  . Sprain of unspecified ligament of right ankle,  initial encounter 09/26/2016  . Essential tremor   . Allergy   . Concussion 03/24/2012    Past Surgical History:  Procedure Laterality Date  . MINOR NAILBED REPAIR Left 09/13/2017   Procedure: IRRIGATION AND DEBRIDEMENT WITH REPAIR  LEFT INDEX FINGER;  Surgeon: Betha LoaKuzma, Kevin, MD;  Location: [facility];  Service: Orthopedics;  Laterality: Left;  . WRIST SURGERY          Home Medications    Prior to Admission medications   Medication Sig Start Date End Date Taking? Authorizing Provider  ALPRAZolam Prudy Feeler(XANAX) 0.5 MG tablet Take 0.5 mg as needed by mouth for anxiety.    [provider]  escitalopram (LEXAPRO) 10 MG tablet Take 10 mg daily by mouth.    [provider]  HYDROcodone-acetaminophen (NORCO) 5-325 MG tablet 1-2 tabs po q6 hours prn pain 09/13/17   Betha LoaKuzma, Kevin, MD  sulfamethoxazole-trimethoprim (BACTRIM DS) 800-160 MG tablet Take 1 tablet 2 (two) times daily by mouth. 09/13/17   Betha LoaKuzma, Kevin, MD    Family History Family History  Problem Relation Age of Onset  . Hypertension Father   . Heart attack Other     Social History Social History   Tobacco Use  . Smoking status: Never Smoker  . Smokeless tobacco: Never Used  Substance Use Topics  . Alcohol use: No  . Drug use: Yes    Types: Marijuana     Allergies   Patient has no known allergies.   Review of Systems Review of Systems  All  other systems reviewed and are negative.    Physical Exam Updated Vital Signs BP 131/86 (BP Location: Right Arm)   Pulse 85   Temp 98.5 F (36.9 C) (Oral)   Resp 16   Ht 6\' 2"  (1.88 m)   Wt 74.8 kg (165 lb)   SpO2 100%   BMI 21.18 kg/m   Physical Exam  Constitutional: He is oriented to person, place, and time. He appears well-developed and well-nourished.  Non-toxic appearance. No distress.  Patient appears anxious.  HENT:  Head: Normocephalic and atraumatic.  Nose: Nose normal.  Mouth/Throat: Oropharynx is clear and moist.  Eyes:  Pupils are equal, round, and reactive to light. Conjunctivae are normal. Right eye exhibits no discharge. Left eye exhibits no discharge.  Neck: Normal range of motion. Neck supple. No JVD present. No tracheal deviation present.  Cardiovascular: Normal rate, regular rhythm, normal heart sounds and intact distal pulses. Exam reveals no gallop and no friction rub.  No murmur heard. Pulmonary/Chest: Effort normal and breath sounds normal. No stridor. No respiratory distress. He has no wheezes. He has no rales. He exhibits no tenderness.  No hypoxia or tachypnea.  Abdominal: Soft. Bowel sounds are normal. He exhibits no distension. There is no tenderness. There is no rebound and no guarding.  Musculoskeletal: Normal range of motion.       Right lower leg: Normal.       Left lower leg: Normal.  No lower extremity edema or calf tenderness.  Lymphadenopathy:    He has no cervical adenopathy.  Neurological: He is alert and oriented to person, place, and time.  Skin: Skin is warm and dry. Capillary refill takes less than 2 seconds. He is not diaphoretic.  Psychiatric: His behavior is normal. Judgment and thought content normal.  Nursing note and vitals reviewed.    ED Treatments / Results  Labs (all labs ordered are listed, but only abnormal results are displayed) Labs Reviewed  BASIC METABOLIC PANEL - Abnormal; Notable for the following components:      Result Value   Potassium 3.3 (*)    Glucose, Bld 106 (*)    All other components within normal limits  CBC  I-STAT TROPONIN, ED  I-STAT TROPONIN, ED    EKG EKG Interpretation  Date/Time:  Wednesday April 10 2018 00:17:24 EDT Ventricular Rate:  61 PR Interval:  148 QRS Duration: 102 QT Interval:  380 QTC Calculation: 382 R Axis:   83 Text Interpretation:  Normal sinus rhythm Normal ECG When compared with ECG of 07/26/2017, No significant change was found Confirmed by Dione Booze ([id number]) on 04/10/2018 12:33:09 AM   Radiology Dg  Chest 2 View  Result Date: 04/10/2018 CLINICAL DATA:  Acute onset of left-sided chest pain and exertional dyspnea. EXAM: CHEST - 2 VIEW COMPARISON:  None. FINDINGS: The lungs are well-aerated and clear. There is no evidence of focal opacification, pleural effusion or pneumothorax. The heart is normal in size; the mediastinal contour is within normal limits. No acute osseous abnormalities are seen. IMPRESSION: No acute cardiopulmonary process seen. Electronically Signed   By: Roanna Raider M.D.   On: 04/10/2018 01:12    Procedures Procedures (including critical care time)  Medications Ordered in ED Medications  gi cocktail (Maalox,Lidocaine,Donnatal) (has no administration in time range)  ketorolac (TORADOL) 30 MG/ML injection 15 mg (has no administration in time range)  potassium chloride SA (K-DUR,KLOR-CON) CR tablet 40 mEq (has no administration in time range)     Initial Impression / Assessment and  Plan / ED Course  I have reviewed the triage vital signs and the nursing notes.  Pertinent labs & imaging results that were available during my care of the patient were reviewed by me and considered in my medical decision making (see chart for details).     Pt presents to the Ed today with complaints of cp. Patient is to be discharged with recommendation to follow up with PCP in regards to today's hospital visit. Chest pain is not likely of cardiac or pulmonary etiology d/t presentation, perc negative, VSS, no tracheal deviation, no JVD or new murmur, RRR, breath sounds equal bilaterally, EKG with normal sinus rhythm without any change from prior tracing and shows no signs of ischemia, negative delta troponin, and negative CXR.  I suspect the patient's symptoms are likely secondary to GERD versus anxiety.  She given Toradol and GI cocktail in the ED.  Some improvement in patient's pain.  Patient with mild hypokalemia 3.3 which was replaced with oral potassium and patient was encouraged to  increase potassium at home and recheck with primary care.  Give prescription for omeprazole.  Pt has been advised to return to the ED is CP becomes exertional, associated with diaphoresis or nausea, radiates to left jaw/arm, worsens or becomes concerning in any way. Pt appears reliable for follow up and is agreeable to discharge.   Pt is hemodynamically stable, in NAD, & able to ambulate in the ED. Evaluation does not show pathology that would require ongoing emergent intervention or inpatient treatment. I explained the diagnosis to the patient. Pain has been managed & has no complaints prior to dc. Pt is comfortable with above plan and is stable for discharge at this time. All questions were answered prior to disposition. Strict return precautions for f/u to the ED were discussed. Encouraged follow up with PCP.     Final Clinical Impressions(s) / ED Diagnoses   Final diagnoses:  Nonspecific chest pain    ED Discharge Orders    None       Wallace Keller 04/10/18 0429    Dione Booze, MD 04/10/18 [phone]

## 2018-05-01 ENCOUNTER — Emergency Department (HOSPITAL_COMMUNITY)
Admission: EM | Admit: 2018-05-01 | Discharge: 2018-05-01 | Disposition: A | Discharge status: Home / Self Care | Payer: BLUE CROSS/BLUE SHIELD | Attending: Emergency Medicine | Admitting: Emergency Medicine

## 2018-05-01 ENCOUNTER — Other Ambulatory Visit: Payer: Self-pay

## 2018-05-01 ENCOUNTER — Encounter (HOSPITAL_COMMUNITY): Payer: Self-pay | Admitting: Emergency Medicine

## 2018-05-01 DIAGNOSIS — R519 Headache, unspecified: Secondary | ICD-10-CM

## 2018-05-01 DIAGNOSIS — R51 Headache: Secondary | ICD-10-CM | POA: Insufficient documentation

## 2018-05-01 DIAGNOSIS — Z79899 Other long term (current) drug therapy: Secondary | ICD-10-CM | POA: Diagnosis not present

## 2018-05-01 MED ORDER — KETOROLAC TROMETHAMINE 30 MG/ML IJ SOLN
60.0000 mg | Freq: Once | INTRAMUSCULAR | Status: AC
  Administered 2018-05-01: 60 mg via INTRAMUSCULAR
  Filled 2018-05-01: qty 2

## 2018-05-01 MED ORDER — DIPHENHYDRAMINE HCL 50 MG/ML IJ SOLN
25.0000 mg | Freq: Once | INTRAMUSCULAR | Status: AC
  Administered 2018-05-01: 25 mg via INTRAMUSCULAR
  Filled 2018-05-01: qty 1

## 2018-05-01 MED ORDER — METOCLOPRAMIDE HCL 5 MG/ML IJ SOLN
10.0000 mg | Freq: Once | INTRAMUSCULAR | Status: AC
  Administered 2018-05-01: 10 mg via INTRAMUSCULAR
  Filled 2018-05-01: qty 2

## 2018-05-01 NOTE — ED Triage Notes (Signed)
Pt states he has had a headache for 2 days, worse over his left eye, feels like his eyebrow has swollen.  Sensitive to light and sound. No nausea.

## 2018-05-01 NOTE — ED Provider Notes (Signed)
[facility] Provider Note   CSN: [id number] Arrival date & time: 05/01/18  [id number]     History   Chief Complaint Chief Complaint  Patient presents with  . Migraine    HPI Wyatt Alexander is a 22 y.o. male.  Patient with a 3-day history of intermittent headaches that are migratory, sometimes on the right, sometimes in the left comes sometimes in the left.  He denies any previous diagnosis of migraines.  Today he came in because the pain was worse over his left eye and he felt like his eyebrow was swollen.  He does have sensitivity to light and sound.  No nausea or vomiting.  No focal weakness, numbness or tingling.  No difficulty speaking or difficulty swallowing.  No chest pain or shortness of breath.  The history is provided by the patient.  Migraine  Associated symptoms include headaches. Pertinent negatives include no chest pain and no shortness of breath.    Past Medical History:  Diagnosis Date  . Allergy   . Depression   . Essential tremor   . Panic attacks     Patient Active Problem List   Diagnosis Date Noted  . Sprain of unspecified ligament of right ankle, initial encounter 09/26/2016  . Essential tremor   . Allergy   . Concussion 03/24/2012    Past Surgical History:  Procedure Laterality Date  . MINOR NAILBED REPAIR Left 09/13/2017   Procedure: IRRIGATION AND DEBRIDEMENT WITH REPAIR  LEFT INDEX FINGER;  Surgeon: Betha LoaKuzma, Kevin, MD;  Location: [facility];  Service: Orthopedics;  Laterality: Left;  . WRIST SURGERY          Home Medications    Prior to Admission medications   Medication Sig Start Date End Date Taking? Authorizing Provider  ALPRAZolam Prudy Feeler(XANAX) 0.5 MG tablet Take 0.5 mg as needed by mouth for anxiety.    [provider]  escitalopram (LEXAPRO) 10 MG tablet Take 10 mg daily by mouth.    [provider]  HYDROcodone-acetaminophen [facility](NORCO) 5-325 MG tablet 1-2 tabs po q6 hours prn  pain 09/13/17   Betha LoaKuzma, Kevin, MD  omeprazole (PRILOSEC) 20 MG capsule Take 1 capsule (20 mg total) by mouth daily. 04/10/18   Rise MuLeaphart, Kenneth T, PA-C  sulfamethoxazole-trimethoprim (BACTRIM DS) 800-160 MG tablet Take 1 tablet 2 (two) times daily by mouth. 09/13/17   Betha LoaKuzma, Kevin, MD    Family History Family History  Problem Relation Age of Onset  . Hypertension Father   . Heart attack Other     Social History Social History   Tobacco Use  . Smoking status: Never Smoker  . Smokeless tobacco: Never Used  Substance Use Topics  . Alcohol use: No  . Drug use: Yes    Types: Marijuana     Allergies   Patient has no known allergies.   Review of Systems Review of Systems  Constitutional: Negative for activity change, appetite change and fever.  HENT: Negative for congestion, postnasal drip and rhinorrhea.   Eyes: Positive for photophobia. Negative for visual disturbance.  Respiratory: Negative for cough, chest tightness and shortness of breath.   Cardiovascular: Negative for chest pain.  Gastrointestinal: Positive for nausea. Negative for vomiting.  Genitourinary: Negative for dysuria, frequency, hematuria, testicular pain and urgency.  Musculoskeletal: Negative for arthralgias and myalgias.  Neurological: Positive for dizziness and headaches. Negative for weakness and light-headedness.     Physical Exam Updated Vital Signs BP 123/63 (BP Location: Right Arm)   Pulse  99   Temp 98.8 F (37.1 C) (Oral)   Resp 18   SpO2 100%   Physical Exam  Constitutional: He is oriented to person, place, and time. He appears well-developed and well-nourished. No distress.  HENT:  Head: Normocephalic and atraumatic.  Mouth/Throat: Oropharynx is clear and moist. No oropharyngeal exudate.  No temporal artery tenderness  Eyes: Pupils are equal, round, and reactive to light. Conjunctivae and EOM are normal.  Neck: Normal range of motion. Neck supple.  No meningismus.  Cardiovascular: Normal  rate, regular rhythm, normal heart sounds and intact distal pulses.  No murmur heard. Pulmonary/Chest: Effort normal and breath sounds normal. No respiratory distress.  Abdominal: Soft. There is no tenderness. There is no rebound and no guarding.  Musculoskeletal: Normal range of motion. He exhibits no edema or tenderness.  Neurological: He is alert and oriented to person, place, and time. No cranial nerve deficit. He exhibits normal muscle tone. Coordination normal.  No ataxia on finger to nose bilaterally. No pronator drift. 5/5 strength throughout. CN 2-12 intact.Equal grip strength. Sensation intact.   Skin: Skin is warm.  Psychiatric: He has a normal mood and affect. His behavior is normal.  Nursing note and vitals reviewed.    ED Treatments / Results  Labs (all labs ordered are listed, but only abnormal results are displayed) Labs Reviewed - No data to display  EKG None  Radiology No results found.  Procedures Procedures (including critical care time)  Medications Ordered in ED Medications - No data to display   Initial Impression / Assessment and Plan / ED Course  I have reviewed the triage vital signs and the nursing notes.  Pertinent labs & imaging results that were available during my care of the patient were reviewed by me and considered in my medical decision making (see chart for details).    3 days of intermittent headache associated with photophobia.  Nonfocal neurological exam.  No thunderclap onset.  Neurological exam is normal. Low suspicion for subarachnoid hemorrhage, meningitis, temporal arteritis  No indication for emergent head CT.  Patient's headache has resolved after receiving Toradol, Reglan and Benadryl.  Vision is intact.  Nonfocal neurological exam with normal gait.  Suspect migraine versus tension headache.  Will give follow-up for neurology, PCP follow-up.  Return precautions discussed.   Final Clinical Impressions(s) / ED Diagnoses    Final diagnoses:  Headache, unspecified headache type    ED Discharge Orders    None       Margaret Cockerill, Jeannett Senior, MD 05/01/18 [phone]

## 2018-05-01 NOTE — ED Notes (Signed)
ED Provider at bedside. 

## 2018-05-01 NOTE — Discharge Instructions (Addendum)
Keep yourself hydrated.  Use Tylenol or ibuprofen as needed for your headaches.  Follow-up with a neurologist.  Return to the ED if you develop worsening pain, weakness, numbness, tingling, fever or any other concerns.

## 2018-05-01 NOTE — ED Notes (Signed)
Signature pad unavailable at time of pt discharge. Pt verbalized understanding of discharge instructions and denied any further requests

## 2018-05-07 ENCOUNTER — Ambulatory Visit (INDEPENDENT_AMBULATORY_CARE_PROVIDER_SITE_OTHER): Payer: BLUE CROSS/BLUE SHIELD | Admitting: Neurology

## 2018-05-07 ENCOUNTER — Other Ambulatory Visit: Payer: Self-pay

## 2018-05-07 ENCOUNTER — Encounter: Payer: Self-pay | Admitting: Neurology

## 2018-05-07 DIAGNOSIS — G43019 Migraine without aura, intractable, without status migrainosus: Secondary | ICD-10-CM

## 2018-05-07 HISTORY — DX: Migraine without aura, intractable, without status migrainosus: G43.019

## 2018-05-07 MED ORDER — VENLAFAXINE HCL ER 37.5 MG PO CP24: ORAL_CAPSULE | ORAL | 3 refills | Status: DC

## 2018-05-07 MED ORDER — RIZATRIPTAN BENZOATE 10 MG PO TABS: 10.0000 mg | ORAL_TABLET | Freq: Three times a day (TID) | ORAL | 3 refills | Status: DC | PRN

## 2018-05-07 MED ORDER — PREDNISONE 5 MG PO TABS: ORAL_TABLET | ORAL | 0 refills | Status: DC

## 2018-05-07 NOTE — Progress Notes (Signed)
Reason for visit: Headache  Referring physician: Yetter  Wyatt Alexander is a 22 y.o. male  History of present illness:  Wyatt Alexander is a 22 year old left-handed white male with a history of headaches that began 2 or 3 weeks prior to this evaluation.  He went to the emergency room on 01 May 2018 because of headaches.  He claims that he did not have a history of headaches until just recently.  The headaches are daily in nature, but he may have periods of time where he does not have any headache pain.  The headaches may be all over the head or in the front, particularly around the left eye.  He has a sensation of swelling around the left eye, the scalp may be tender in the left temporal area.  The patient indicates that his sister also has migraine headaches.  The patient reports photophobia and phonophobia, no nausea or vomiting.  He is not missing work because of the headache.  He does have some problems with underlying anxiety, he was seen earlier in the emergency room for chest pain.  The patient reports that exercise may increase the headache.  He has taken [facility] powders and Excedrin Migraine without much benefit.  The patient reports no significant numbness or weakness of the extremities, he may occasionally have some tingling in the left arm.  He denies any severe dizziness, his balance is slightly off at times, he denies any falls.  He denies issues controlling the bowels or the bladder.  He does have some mild troubles with insomnia at times.  He comes to this office for an evaluation.  He denies any significant allergy symptoms or sinus drainage.  He drinks 2 caffeinated soft drinks daily, he also drinks tea.  Past Medical History:  Diagnosis Date  . Allergy   . Depression   . Essential tremor   . Panic attacks     Past Surgical History:  Procedure Laterality Date  . FINGER SURGERY Left   . MINOR NAILBED REPAIR Left 09/13/2017   Procedure: IRRIGATION AND DEBRIDEMENT WITH REPAIR  LEFT  INDEX FINGER;  Surgeon: Betha Loa, MD;  Location: [facility];  Service: Orthopedics;  Laterality: Left;  . WRIST SURGERY  2012   cyst removed    Family History  Problem Relation Age of Onset  . Hypertension Father   . Heart attack Other     Social history:  reports that he has never smoked. He has never used smokeless tobacco. He reports that he has current or past drug history. Drug: Marijuana. He reports that he does not drink alcohol.  Medications:  Prior to Admission medications   Medication Sig Start Date End Date Taking? Authorizing Provider  omeprazole (PRILOSEC) 20 MG capsule Take 1 capsule (20 mg total) by mouth daily. 04/10/18  Yes Leaphart, Iantha Fallen T, PA-C     No Known Allergies  ROS:  Out of a complete 14 system review of symptoms, the patient complains only of the following symptoms, and all other reviewed systems are negative.  Chest pain Ringing in the ears Blurred vision, shortness of breath Aching muscles Headache, numbness, dizziness Anxiety, not enough sleep, decreased energy Insomnia, sleepiness  Blood pressure 130/69, pulse (!) 56, height 6\' 2"  (1.88 m), weight 165 lb 8 oz (75.1 kg).  Physical Exam  General: The patient is alert and cooperative at the time of the examination.  Eyes: Pupils are equal, round, and reactive to light. Discs are flat bilaterally.  Good venous pulsations are seen.  Neck: The neck is supple, no carotid bruits are noted.  Respiratory: The respiratory examination is clear.  Cardiovascular: The cardiovascular examination reveals a regular rate and rhythm, no obvious murmurs or rubs are noted.  Skin: Extremities are without significant edema.  Neurologic Exam  Mental status: The patient is alert and oriented x 3 at the time of the examination. The patient has apparent normal recent and remote memory, with an apparently normal attention span and concentration ability.  Cranial nerves: Facial symmetry is  present. There is good sensation of the face to pinprick and soft touch bilaterally. The strength of the facial muscles and the muscles to head turning and shoulder shrug are normal bilaterally. Speech is well enunciated, no aphasia or dysarthria is noted. Extraocular movements are full. Visual fields are full. The tongue is midline, and the patient has symmetric elevation of the soft palate. No obvious hearing deficits are noted.  Motor: The motor testing reveals 5 over 5 strength of all 4 extremities. Good symmetric motor tone is noted throughout.  Sensory: Sensory testing is intact to pinprick, soft touch, vibration sensation, and position sense on all 4 extremities. No evidence of extinction is noted.  Coordination: Cerebellar testing reveals good finger-nose-finger and heel-to-shin bilaterally.  Gait and station: Gait is normal. Tandem gait is normal. Romberg is negative. No drift is seen.  Reflexes: Deep tendon reflexes are symmetric and normal bilaterally. Toes are downgoing bilaterally.   Assessment/Plan:  1.  Headache, probable migraine  2.  Anxiety disorder  The patient will be placed on Effexor starting low-dose, 37.5 mg daily for 2 weeks and then go to 75 mg daily.  He will be given Maxalt to take if needed, he will be given a 6-day course of prednisone.  The patient will follow-up through this office in 3 months.  If his headaches do not abate, CT or MRI of the brain will be done.  Marlan Palau. Keith Lorana Maffeo MD 05/07/2018 8:50 AM  [facility] [address] [place], [place] [id number]  Phone [phone] Fax [phone]

## 2018-05-07 NOTE — Patient Instructions (Signed)
We will start a 6 day course of prednisone.  Start Effexor 37.5 mg tablet one a day for 2 weeks, then take 2 a day.  Use Maxalt if needed for the headache.  You can also use ibuprofen if needed.

## 2018-07-12 ENCOUNTER — Encounter (HOSPITAL_COMMUNITY): Payer: Self-pay | Admitting: Emergency Medicine

## 2018-07-12 ENCOUNTER — Ambulatory Visit (HOSPITAL_COMMUNITY)
Admission: EM | Admit: 2018-07-12 | Discharge: 2018-07-12 | Disposition: A | Discharge status: Home / Self Care | Payer: BLUE CROSS/BLUE SHIELD | Attending: Family Medicine | Admitting: Family Medicine

## 2018-07-12 DIAGNOSIS — G250 Essential tremor: Secondary | ICD-10-CM | POA: Insufficient documentation

## 2018-07-12 DIAGNOSIS — J039 Acute tonsillitis, unspecified: Secondary | ICD-10-CM | POA: Diagnosis not present

## 2018-07-12 DIAGNOSIS — Z8249 Family history of ischemic heart disease and other diseases of the circulatory system: Secondary | ICD-10-CM | POA: Insufficient documentation

## 2018-07-12 DIAGNOSIS — J029 Acute pharyngitis, unspecified: Secondary | ICD-10-CM | POA: Diagnosis present

## 2018-07-12 DIAGNOSIS — K219 Gastro-esophageal reflux disease without esophagitis: Secondary | ICD-10-CM | POA: Diagnosis not present

## 2018-07-12 DIAGNOSIS — Z79899 Other long term (current) drug therapy: Secondary | ICD-10-CM | POA: Diagnosis not present

## 2018-07-12 DIAGNOSIS — F329 Major depressive disorder, single episode, unspecified: Secondary | ICD-10-CM | POA: Insufficient documentation

## 2018-07-12 LAB — POCT RAPID STREP A: Streptococcus, Group A Screen (Direct): NEGATIVE

## 2018-07-12 MED ORDER — AMOXICILLIN-POT CLAVULANATE 875-125 MG PO TABS: 1.0000 | ORAL_TABLET | Freq: Two times a day (BID) | ORAL | 0 refills | Status: AC

## 2018-07-12 NOTE — ED Provider Notes (Signed)
[facility]    CSN: [id number] Arrival date & time: 07/12/18  1057     History   Chief Complaint Chief Complaint  Patient presents with  . Sore Throat    HPI Wyatt Alexander is a 22 y.o. male.   22 year old male presents with sore throat for over 1 week and getting worse over the past 2 days. Also having swollen glands and headaches. Denies any fever, nasal congestion, cough or diarrhea. Has been having some nausea and vomiting from GERD- has not been taking his Prilosec as prescribed. No known exposure to strep. No other family members ill. Has history of migraine headaches and currently on Effexor daily and Maxalt prn.   The history is provided by the patient.    Past Medical History:  Diagnosis Date  . Allergy   . Common migraine with intractable migraine 05/07/2018  . Depression   . Essential tremor   . Panic attacks     Patient Active Problem List   Diagnosis Date Noted  . Common migraine with intractable migraine 05/07/2018  . Sprain of unspecified ligament of right ankle, initial encounter 09/26/2016  . Essential tremor   . Allergy   . Concussion 03/24/2012    Past Surgical History:  Procedure Laterality Date  . FINGER SURGERY Left   . MINOR NAILBED REPAIR Left 09/13/2017   Procedure: IRRIGATION AND DEBRIDEMENT WITH REPAIR  LEFT INDEX FINGER;  Surgeon: Betha Loa, MD;  Location: [facility];  Service: Orthopedics;  Laterality: Left;  . WRIST SURGERY  2012   cyst removed       Home Medications    Prior to Admission medications   Medication Sig Start Date End Date Taking? Authorizing Provider  amoxicillin-clavulanate (AUGMENTIN) 875-125 MG tablet Take 1 tablet by mouth every 12 (twelve) hours for 7 days. 07/12/18 07/19/18  Sudie Grumbling, NP  omeprazole (PRILOSEC) 20 MG capsule Take 1 capsule (20 mg total) by mouth daily. 04/10/18   Rise Mu, PA-C  rizatriptan (MAXALT) 10 MG tablet Take 1 tablet (10 mg total) by mouth 3  (three) times daily as needed for migraine. 05/07/18   York Spaniel, MD  venlafaxine XR (EFFEXOR XR) 37.5 MG 24 hr capsule One tablet daily for 2 weeks, then take 2 tablets daily 05/07/18   York Spaniel, MD    Family History Family History  Problem Relation Age of Onset  . Hypertension Father   . Migraines Sister   . Heart attack Other     Social History Social History   Tobacco Use  . Smoking status: Never Smoker  . Smokeless tobacco: Never Used  Substance Use Topics  . Alcohol use: No  . Drug use: Yes    Types: Marijuana     Allergies   Patient has no known allergies.   Review of Systems Review of Systems  Constitutional: Positive for fatigue. Negative for activity change, appetite change, chills and fever.  HENT: Positive for sore throat. Negative for congestion, ear pain, facial swelling, mouth sores, nosebleeds, postnasal drip, rhinorrhea, sinus pressure, sinus pain, sneezing and trouble swallowing.   Eyes: Negative for pain, discharge, redness and itching.  Respiratory: Negative for cough, chest tightness, shortness of breath and wheezing.   Gastrointestinal: Positive for nausea and vomiting. Negative for diarrhea.  Musculoskeletal: Negative for arthralgias, myalgias, neck pain and neck stiffness.  Skin: Negative for color change, rash and wound.  Allergic/Immunologic: Negative for immunocompromised state.  Neurological: Positive for headaches. Negative  for dizziness, seizures, syncope, speech difficulty, weakness, light-headedness and numbness.  Hematological: Positive for adenopathy.     Physical Exam Triage Vital Signs ED Triage Vitals [07/12/18 1113]  Enc Vitals Group     BP 127/64     Pulse Rate 67     Resp 18     Temp 98.4 F (36.9 C)     Temp Source Oral     SpO2 100 %     Weight      Height      Head Circumference      Peak Flow      Pain Score      Pain Loc      Pain Edu?      Excl. in GC?    No data found.  Updated Vital  Signs BP 127/64 (BP Location: Right Arm)   Pulse 67   Temp 98.4 F (36.9 C) (Oral)   Resp 18   SpO2 100%   Visual Acuity Right Eye Distance:   Left Eye Distance:   Bilateral Distance:    Right Eye Near:   Left Eye Near:    Bilateral Near:     Physical Exam  Constitutional: He is oriented to person, place, and time. Vital signs are normal. He appears well-developed and well-nourished. He is cooperative. He appears ill. No distress.  Patient sitting on exam table in no acute distress but appears ill.   HENT:  Head: Normocephalic and atraumatic.  Right Ear: Hearing, tympanic membrane, external ear and ear canal normal.  Left Ear: Hearing, tympanic membrane, external ear and ear canal normal.  Nose: Nose normal. No mucosal edema. Right sinus exhibits no maxillary sinus tenderness and no frontal sinus tenderness. Left sinus exhibits no maxillary sinus tenderness and no frontal sinus tenderness.  Mouth/Throat: Uvula is midline and mucous membranes are normal. Posterior oropharyngeal erythema (petecchiae on palate) present. No oropharyngeal exudate or posterior oropharyngeal edema. Tonsils are 2+ on the right. Tonsils are 2+ on the left. No tonsillar exudate.  Eyes: Conjunctivae and EOM are normal.  Neck: Normal range of motion. Neck supple.  Cardiovascular: Normal rate, regular rhythm and normal heart sounds.  No murmur heard. Pulmonary/Chest: Effort normal and breath sounds normal. No respiratory distress. He has no decreased breath sounds. He has no wheezes. He has no rhonchi. He has no rales.  Lymphadenopathy:       Head (right side): Tonsillar adenopathy present.       Head (left side): Tonsillar adenopathy present.    He has cervical adenopathy.       Right cervical: Superficial cervical adenopathy present.       Left cervical: Superficial cervical adenopathy present.  Neurological: He is alert and oriented to person, place, and time.  Skin: Skin is warm and dry. Capillary refill  takes less than 2 seconds. No rash noted.  Psychiatric: He has a normal mood and affect. His behavior is normal. Judgment and thought content normal.  Vitals reviewed.    UC Treatments / Results  Labs (all labs ordered are listed, but only abnormal results are displayed) Labs Reviewed  CULTURE, GROUP A STREP [facility])  POCT RAPID STREP A    EKG None  Radiology No results found.  Procedures Procedures (including critical care time)  Medications Ordered in UC Medications - No data to display  Initial Impression / Assessment and Plan / UC Course  I have reviewed the triage vital signs and the nursing notes.  Pertinent labs & imaging results  that were available during my care of the patient were reviewed by me and considered in my medical decision making (see chart for details).    Reviewed negative rapid strep test with patient. Discussed that he has Tonsillitis- will treat for strep or other bacterial infection due to clinical findings. Start Augmentin 875mg  twice a day as directed. Continue Ibuprofen 600mg  every 6 hours as needed for pain and swelling. Encouraged to restart Prilosec to help with GERD symptoms. Follow-up pending lab results and in 3 days with his PCP if not improving.  Final Clinical Impressions(s) / UC Diagnoses   Final diagnoses:  Acute tonsillitis, unspecified etiology     Discharge Instructions     Recommend start Augmentin 875mg  twice a day as directed. Continue Ibuprofen 600mg  every 6 hours as needed for pain. Follow-up pending lab results and in 3 to 4 days if not improving.     ED Prescriptions    Medication Sig Dispense Auth. Provider   amoxicillin-clavulanate (AUGMENTIN) 875-125 MG tablet Take 1 tablet by mouth every 12 (twelve) hours for 7 days. 14 tablet Sudie Grumbling, NP     Controlled Substance Prescriptions [place] Controlled Substance Registry consulted? Not Applicable   Sudie Grumbling, NP 07/13/18 [phone]

## 2018-07-12 NOTE — Discharge Instructions (Addendum)
Recommend start Augmentin 875mg  twice a day as directed. Continue Ibuprofen 600mg  every 6 hours as needed for pain. Follow-up pending lab results and in 3 to 4 days if not improving.

## 2018-07-12 NOTE — ED Triage Notes (Signed)
Pt here for sore throat x 1 week 

## 2018-07-14 LAB — CULTURE, GROUP A STREP (THRC)

## 2018-08-13 DIAGNOSIS — K219 Gastro-esophageal reflux disease without esophagitis: Secondary | ICD-10-CM | POA: Insufficient documentation

## 2018-08-26 NOTE — Progress Notes (Signed)
[facility]  PATIENT: Wyatt Alexander DOB: [birth year]   REASON FOR VISIT: Follow-up for migraine HISTORY FROM: Patient    HISTORY OF PRESENT ILLNESS:UPDATE 10/22/2019CM Mr. Abdelaziz, 22 year old male returns for follow-up.  He has a history of headaches which were daily in nature when last seen.  He was placed on Effexor for his anxiety disorder by Dr. Anne Hahn and now his headaches are 3 times a week or less.  Maxalt works acutely.  He continues to work 2 jobs.  Headaches may be all over the head or in the front particularly around the left eye.  He has photophobia and phonophobia no nausea or vomiting.  The patient has not had any weakness of the extremities but he will occasionally complain with some tingling in the left arm.  He denies dizziness he denies any falls.  He is not aware of any particular foods that cause problems he continues to drink a lot of caffeinated products and was cautioned against that.  He has not missed any work due to his headaches.  He was recently seen in the ER for tonsillitis, given Augmentin and also Prilosec for his GERD symptoms.  He returns for reevaluation   7/2/19KW Mr. Delozier is a 22 year old left-handed white male with a history of headaches that began 2 or 3 weeks prior to this evaluation.  He went to the emergency room on 01 May 2018 because of headaches.  He claims that he did not have a history of headaches until just recently.  The headaches are daily in nature, but he may have periods of time where he does not have any headache pain.  The headaches may be all over the head or in the front, particularly around the left eye.  He has a sensation of swelling around the left eye, the scalp may be tender in the left temporal area.  The patient indicates that his sister also has migraine headaches.  The patient reports photophobia and phonophobia, no nausea or vomiting.  He is not missing work because of the headache.  He does have some problems  with underlying anxiety, he was seen earlier in the emergency room for chest pain.  The patient reports that exercise may increase the headache.  He has taken [facility] powders and Excedrin Migraine without much benefit.  The patient reports no significant numbness or weakness of the extremities, he may occasionally have some tingling in the left arm.  He denies any severe dizziness, his balance is slightly off at times, he denies any falls.  He denies issues controlling the bowels or the bladder.  He does have some mild troubles with insomnia at times.  He comes to this office for an evaluation.  He denies any significant allergy symptoms or sinus drainage.  He drinks 2 caffeinated soft drinks daily, he also drinks tea.  REVIEW OF SYSTEMS: Full 14 system review of systems performed and notable only for those listed, all others are neg:  Constitutional: neg  Cardiovascular: neg Ear/Nose/Throat: neg  Skin: neg Eyes: neg Respiratory: neg Gastroitestinal: neg  Hematology/Lymphatic: neg  Endocrine: neg Musculoskeletal:neg Allergy/Immunology: neg Neurological: Headache Psychiatric: Anxiety disorder Sleep : neg   ALLERGIES: No Known Allergies  HOME MEDICATIONS: Outpatient Medications Prior to Visit  Medication Sig Dispense Refill  . pantoprazole (PROTONIX) 40 MG tablet Take 40 mg by mouth daily.  1  . rizatriptan (MAXALT) 10 MG tablet Take 1 tablet (10 mg total) by mouth 3 (three) times daily as needed for migraine. 10  tablet 3  . venlafaxine XR (EFFEXOR XR) 37.5 MG 24 hr capsule One tablet daily for 2 weeks, then take 2 tablets daily 60 capsule 3  . Topiramate ER (TROKENDI XR) 50 MG CP24 Take by mouth.    Marland Kitchen omeprazole (PRILOSEC) 20 MG capsule Take 1 capsule (20 mg total) by mouth daily. 14 capsule 0   No facility-administered medications prior to visit.     PAST MEDICAL HISTORY: Past Medical History:  Diagnosis Date  . Allergy   . Common migraine with intractable migraine 05/07/2018  .  Depression   . Essential tremor   . Panic attacks     PAST SURGICAL HISTORY: Past Surgical History:  Procedure Laterality Date  . FINGER SURGERY Left   . MINOR NAILBED REPAIR Left 09/13/2017   Procedure: IRRIGATION AND DEBRIDEMENT WITH REPAIR  LEFT INDEX FINGER;  Surgeon: Betha Loa, MD;  [facility];  Service: Orthopedics;  Laterality: Left;  . WRIST SURGERY  2012   cyst removed    FAMILY HISTORY: Family History  Problem Relation Age of Onset  . Hypertension Father   . Migraines Sister   . Heart attack Other     SOCIAL HISTORY: Social History   Socioeconomic History  . Marital status: Single    Spouse name: Not on file  . Number of children: 0  . Years of education: in college  . Highest education level: Not on file  Occupational History  . Occupation: Psychologist, clinical. [employer]  Social Needs  . Financial resource strain: Not on file  . Food insecurity:    Worry: Not on file    Inability: Not on file  . Transportation needs:    Medical: Not on file    Non-medical: Not on file  Tobacco Use  . Smoking status: Never Smoker  . Smokeless tobacco: Never Used  Substance and Sexual Activity  . Alcohol use: No  . Drug use: Yes    Types: Marijuana  . Sexual activity: Not on file    Comment: Attending community college with plans to transfer to 4 year institution.  Lifestyle  . Physical activity:    Days per week: Not on file    Minutes per session: Not on file  . Stress: Not on file  Relationships  . Social connections:    Talks on phone: Not on file    Gets together: Not on file    Attends religious service: Not on file    Active member of club or organization: Not on file    Attends meetings of clubs or organizations: Not on file    Relationship status: Not on file  . Intimate partner violence:    Fear of current or ex partner: Not on file    Emotionally abused: Not on file    Physically abused: Not on file    Forced sexual  activity: Not on file  Other Topics Concern  . Not on file  Social History Narrative   Lives   Caffeine use:      PHYSICAL EXAM  Vitals:   08/27/18 0751  BP: (!) 98/57  Pulse: (!) 55  Weight: 178 lb 12.8 oz (81.1 kg)  Height: 6\' 2"  (1.88 m)   Body mass index is 22.96 kg/m.  Generalized: Well developed, in no acute distress  Head: normocephalic and atraumatic,. Oropharynx benign  Neck: Supple,  Musculoskeletal: No deformity  Skin no rash or edema Neurological examination   Mentation: Alert oriented to time, place, history  taking. Attention span and concentration appropriate. Recent and remote memory intact.  Follows all commands speech and language fluent.   Cranial nerve II-XII: .Pupils were equal round reactive to light extraocular movements were full, visual field were full on confrontational test. Facial sensation and strength were normal. hearing was intact to finger rubbing bilaterally. Uvula tongue midline. head turning and shoulder shrug were normal and symmetric.Tongue protrusion into cheek strength was normal. Motor: normal bulk and tone, full strength in the BUE, BLE, fine finger movements normal, no pronator drift. No focal weakness Sensory: normal and symmetric to light touch, in the upper and lower extremities Coordination: finger-nose-finger, heel-to-shin bilaterally, no dysmetria Reflexes: Brachioradialis 2/2, biceps 2/2, triceps 2/2, patellar 2/2, Achilles 2/2, plantar responses were flexor bilaterally. Gait and Station: Rising up from seated position without assistance, normal stance,  moderate stride, good arm swing, smooth turning, able to perform tiptoe, and heel walking without difficulty. Tandem gait is steady  DIAGNOSTIC DATA (LABS, IMAGING, TESTING) - I reviewed patient records, labs, notes, testing and imaging myself where available.  Lab Results  Component Value Date   WBC 7.0 04/10/2018   HGB 13.9 04/10/2018   HCT 40.5 04/10/2018   MCV 89.4  04/10/2018   PLT 183 04/10/2018      Component Value Date/Time   NA 139 04/10/2018 0035   K 3.3 (L) 04/10/2018 0035   CL 103 04/10/2018 0035   CO2 28 04/10/2018 0035   GLUCOSE 106 (H) 04/10/2018 0035   BUN 12 04/10/2018 0035   CREATININE 1.01 04/10/2018 0035   CREATININE 0.81 03/18/2015 1137   CALCIUM 9.0 04/10/2018 0035   PROT 7.1 01/10/2016 0125   ALBUMIN 4.4 01/10/2016 0125   AST 32 01/10/2016 0125   ALT 23 01/10/2016 0125   ALKPHOS 78 01/10/2016 0125   BILITOT 1.5 (H) 01/10/2016 0125   GFRNONAA >60 04/10/2018 0035   GFRNONAA >89 03/18/2015 1137   GFRAA >60 04/10/2018 0035   GFRAA >89 03/18/2015 1137    Lab Results  Component Value Date   TSH 2.247 03/18/2015      ASSESSMENT AND PLAN  22 y.o. year old male  has a past medical history of Common migraine with intractable migraine (05/07/2018), Depression, Essential tremor, and Panic attacks. here to follow-up for his migraine headaches and anxiety disorder.   PLAN: Continue Effexor 75 mg daily Continue Maxalt 10 mg acutely Start topiramate 50 mg ER daily prescribed by primary care Given list of migraine triggers that are foods and reviewed these with patient Limit over-the-counter drugs due to rebound Follow-up in 4 months Nilda Riggs, [facility], [facility], APRN  [facility] [address], [address] [place], [place] [address] [phone]

## 2018-08-27 ENCOUNTER — Encounter: Payer: Self-pay | Admitting: Nurse Practitioner

## 2018-08-27 ENCOUNTER — Ambulatory Visit: Payer: BLUE CROSS/BLUE SHIELD | Admitting: Nurse Practitioner

## 2018-08-27 VITALS — BP 98/57 | HR 55 | Ht 74.0 in | Wt 178.8 lb

## 2018-08-27 DIAGNOSIS — F419 Anxiety disorder, unspecified: Secondary | ICD-10-CM

## 2018-08-27 DIAGNOSIS — G43019 Migraine without aura, intractable, without status migrainosus: Secondary | ICD-10-CM

## 2018-08-27 MED ORDER — VENLAFAXINE HCL 75 MG PO TABS: 75.0000 mg | ORAL_TABLET | Freq: Every day | ORAL | 4 refills | Status: DC

## 2018-08-27 MED ORDER — RIZATRIPTAN BENZOATE 10 MG PO TABS: 10.0000 mg | ORAL_TABLET | Freq: Three times a day (TID) | ORAL | 4 refills | Status: DC | PRN

## 2018-08-27 NOTE — Patient Instructions (Addendum)
Continue Effexor 75 mg daily Continue Maxalt 10 mg acutely Start topiramate 50 mg ER daily Given list of migraine triggers that are foods Follow-up in 4 months  Migraine Headache A migraine headache is a very strong throbbing pain on one side or both sides of your head. Migraines can also cause other symptoms. Talk with your doctor about what things may bring on (trigger) your migraine headaches. Follow these instructions at home: Medicines  Take over-the-counter and prescription medicines only as told by your doctor.  Do not drive or use heavy machinery while taking prescription pain medicine.  To prevent or treat constipation while you are taking prescription pain medicine, your doctor may recommend that you: ? Drink enough fluid to keep your pee (urine) clear or pale yellow. ? Take over-the-counter or prescription medicines. ? Eat foods that are high in fiber. These include fresh fruits and vegetables, whole grains, and beans. ? Limit foods that are high in fat and processed sugars. These include fried and sweet foods. Lifestyle  Avoid alcohol.  Do not use any products that contain nicotine or tobacco, such as cigarettes and e-cigarettes. If you need help quitting, ask your doctor.  Get at least 8 hours of sleep every night.  Limit your stress. General instructions   Keep a journal to find out what may bring on your migraines. For example, write down: ? What you eat and drink. ? How much sleep you get. ? Any change in what you eat or drink. ? Any change in your medicines.  If you have a migraine: ? Avoid things that make your symptoms worse, such as bright lights. ? It may help to lie down in a dark, quiet room. ? Do not drive or use heavy machinery. ? Ask your doctor what activities are safe for you.  Keep all follow-up visits as told by your doctor. This is important. Contact a doctor if:  You get a migraine that is different or worse than your usual  migraines. Get help right away if:  Your migraine gets very bad.  You have a fever.  You have a stiff neck.  You have trouble seeing.  Your muscles feel weak or like you cannot control them.  You start to lose your balance a lot.  You start to have trouble walking.  You pass out (faint). This information is not intended to replace advice given to you by your health care provider. Make sure you discuss any questions you have with your health care provider. Document Released: 08/01/2008 Document Revised: 05/12/2016 Document Reviewed: 04/10/2016 Elsevier Interactive Patient Education  2018 [employer].

## 2018-08-27 NOTE — Progress Notes (Signed)
I have read the note, and I agree with the clinical assessment and plan.  Tenicia Gural K Ryaan Vanwagoner   

## 2018-09-29 IMAGING — CR DG FINGER INDEX 2+V*L*
3 series · 3 of 3 positions shown · non-contrast
Comparison: None.

CLINICAL DATA: 21-year-old male with left index finger trauma. Note
is made of overlying laceration.

EXAM:
LEFT INDEX FINGER 2+V

[x finger pa left]
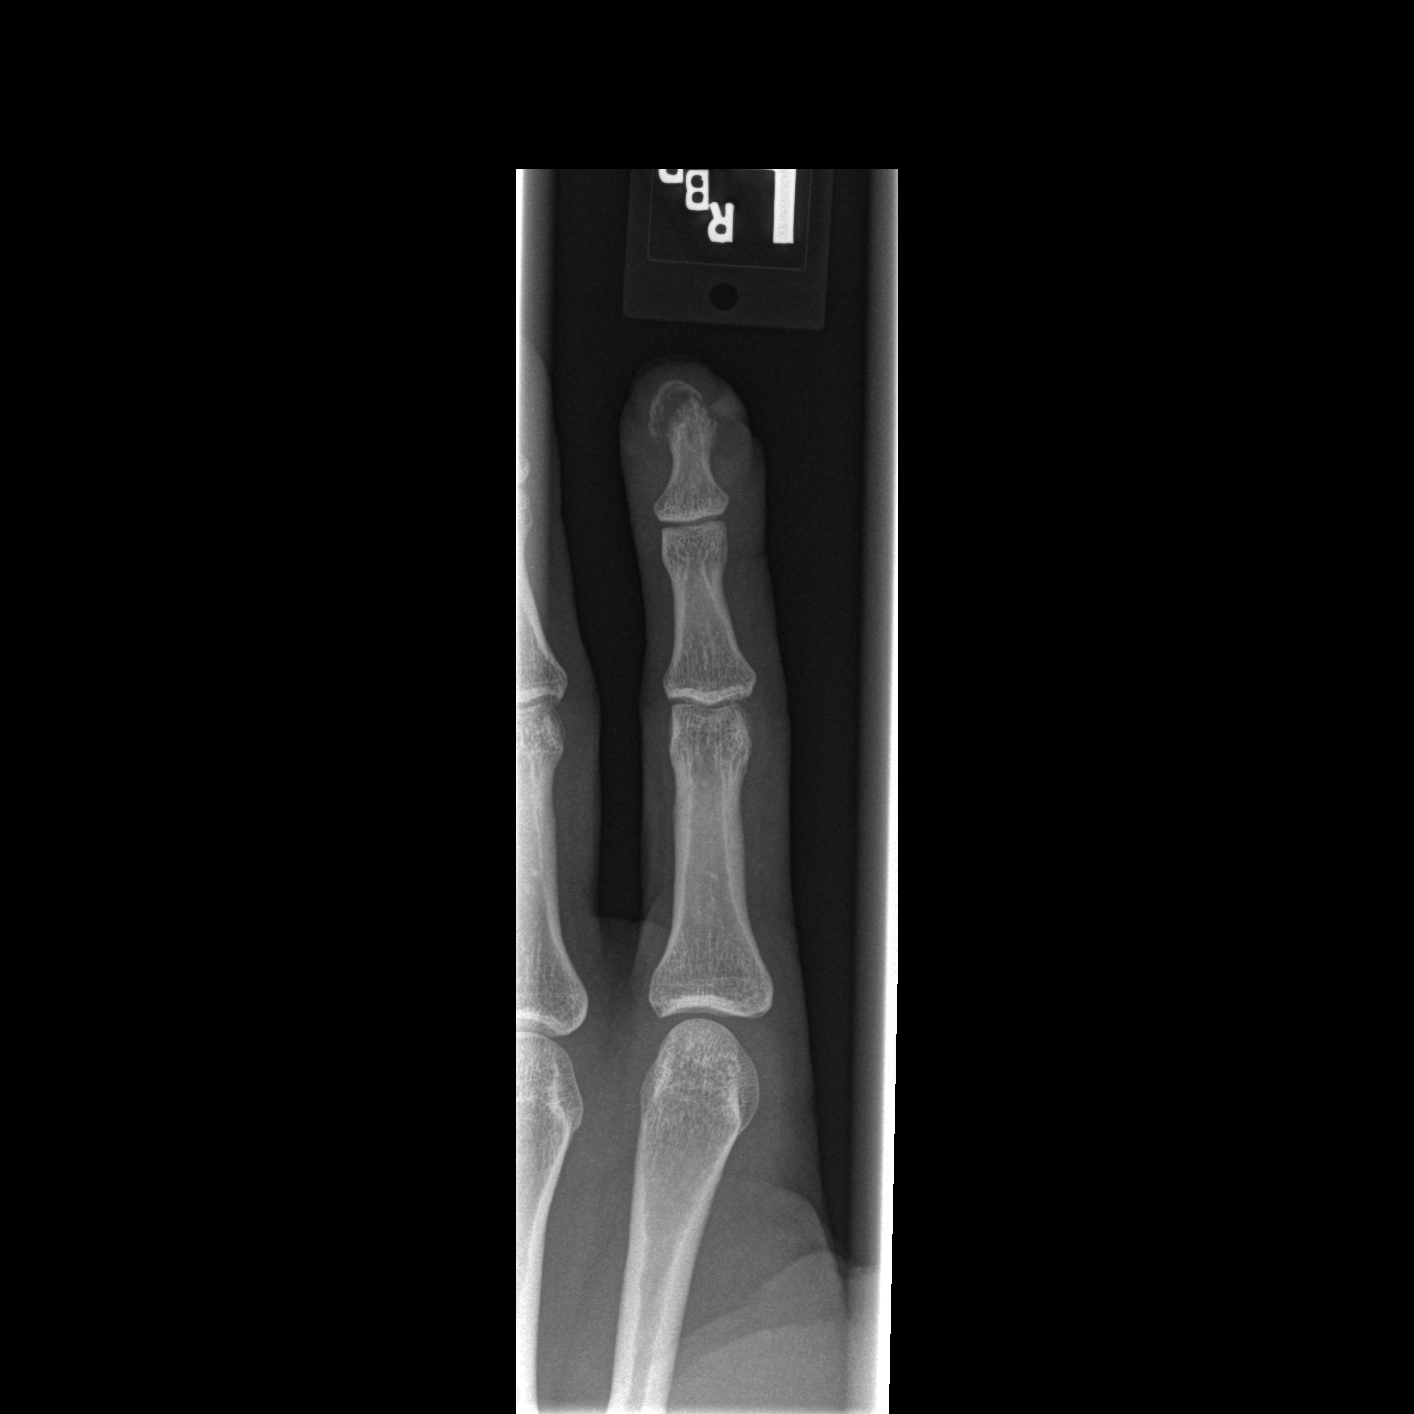

[x finger obl. left]
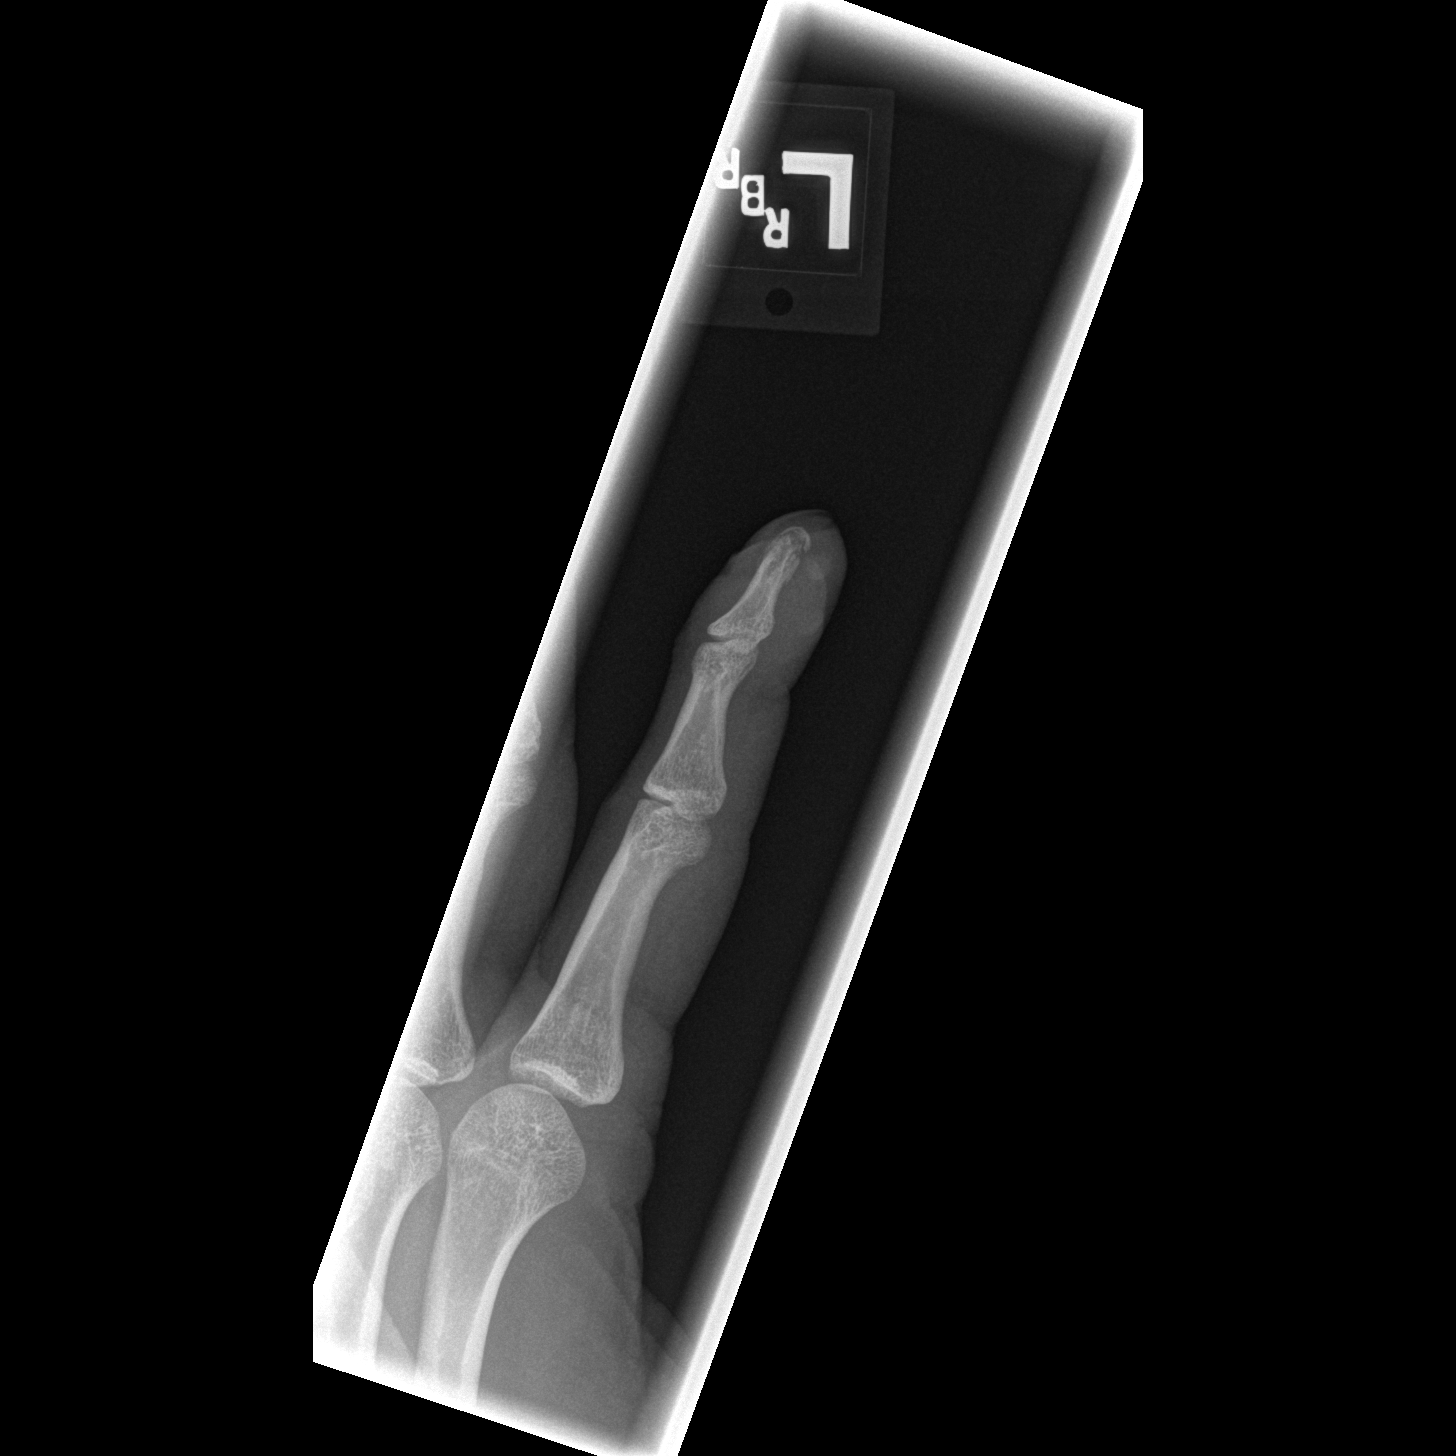

[x finger lateral left]
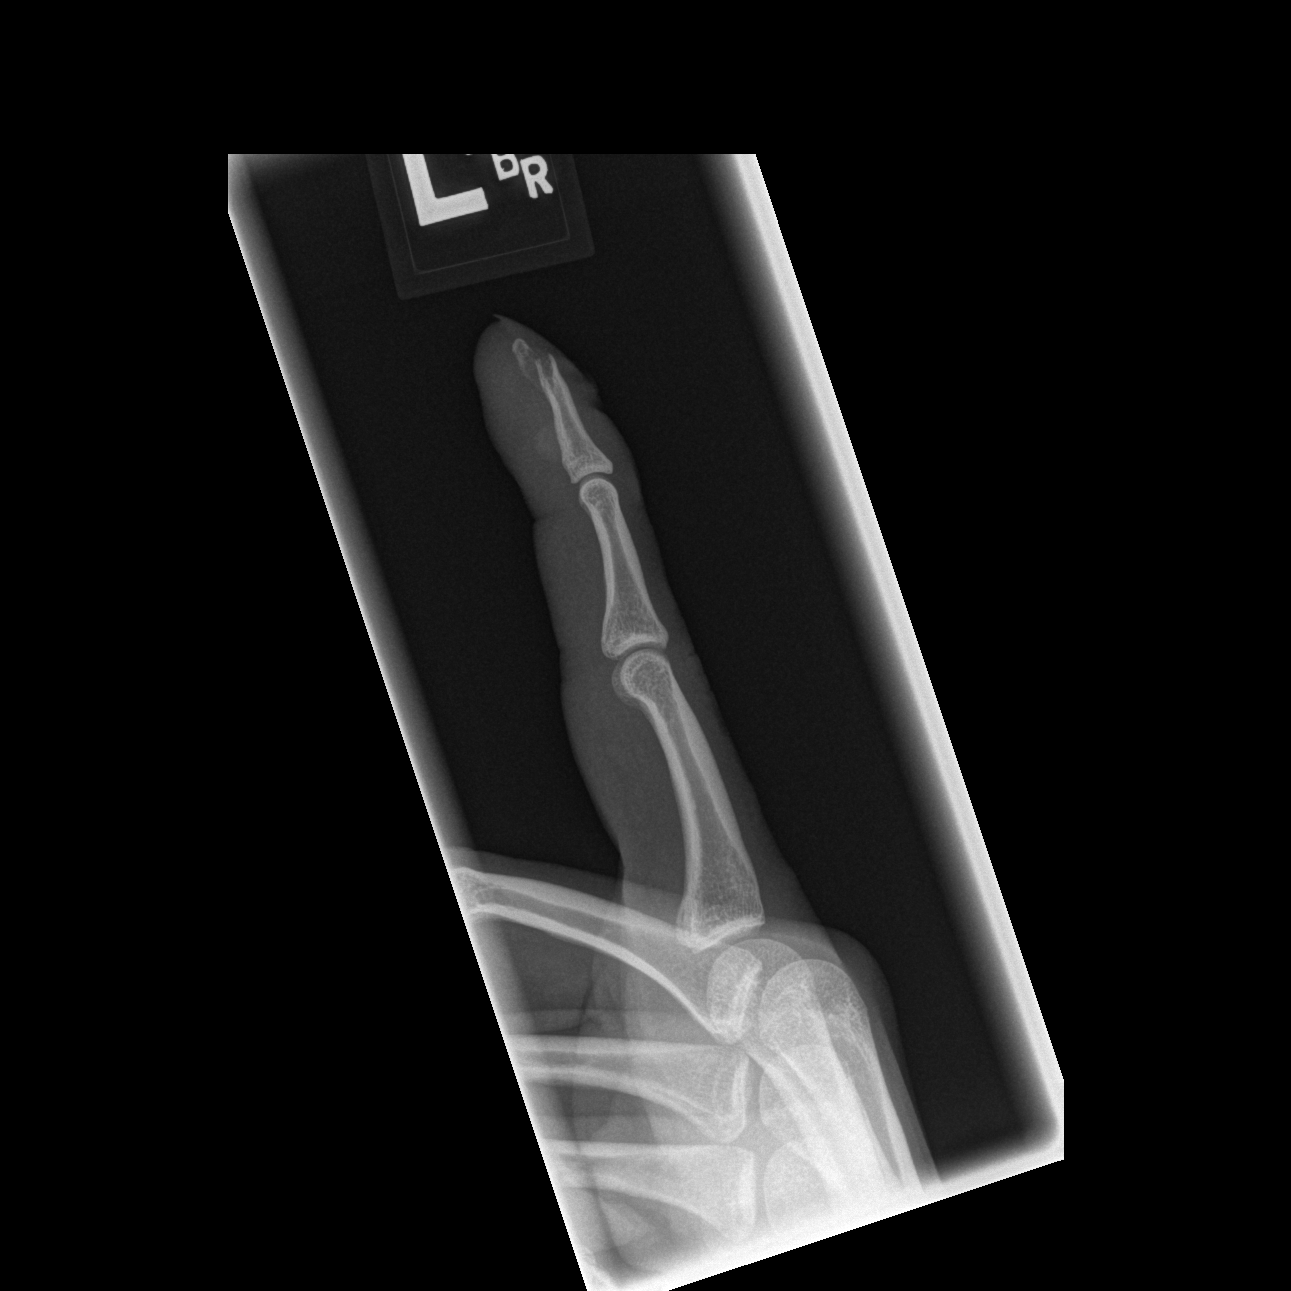

[3 of 3 positions shown; findings below may reference images not displayed]

FINDINGS: There is a fracture at the tuft of the distal phalanx with mild
volar angulation of the distal fracture fragment. No additional
fractures noted. Marked overlying soft tissue swelling and
irregularity.
IMPRESSION: Fracture at the tuft of the distal phalanx with mild volar
angulation of the distal fracture fragment. Overlying soft tissue
swelling and irregularity.

## 2018-12-26 NOTE — Progress Notes (Deleted)
[facility]  PATIENT: Wyatt Alexander DOB: [birth year]   REASON FOR VISIT: Follow-up for migraine HISTORY FROM: Patient    HISTORY OF PRESENT ILLNESS:UPDATE 10/22/2019CM Mr. Wyatt Alexander, 23 year old male returns for follow-up.  He has a history of headaches which were daily in nature when last seen.  He was placed on Effexor for his anxiety disorder by Dr. Anne Hahn and now his headaches are 3 times a week or less.  Maxalt works acutely.  He continues to work 2 jobs.  Headaches may be all over the head or in the front particularly around the left eye.  He has photophobia and phonophobia no nausea or vomiting.  The patient has not had any weakness of the extremities but he will occasionally complain with some tingling in the left arm.  He denies dizziness he denies any falls.  He is not aware of any particular foods that cause problems he continues to drink a lot of caffeinated products and was cautioned against that.  He has not missed any work due to his headaches.  He was recently seen in the ER for tonsillitis, given Augmentin and also Prilosec for his GERD symptoms.  He returns for reevaluation   7/2/19KW Mr. Wyatt Alexander is a 23 year old left-handed white male with a history of headaches that began 2 or 3 weeks prior to this evaluation.  He went to the emergency room on 01 May 2018 because of headaches.  He claims that he did not have a history of headaches until just recently.  The headaches are daily in nature, but he may have periods of time where he does not have any headache pain.  The headaches may be all over the head or in the front, particularly around the left eye.  He has a sensation of swelling around the left eye, the scalp may be tender in the left temporal area.  The patient indicates that his sister also has migraine headaches.  The patient reports photophobia and phonophobia, no nausea or vomiting.  He is not missing work because of the headache.  He does have some problems  with underlying anxiety, he was seen earlier in the emergency room for chest pain.  The patient reports that exercise may increase the headache.  He has taken [facility] powders and Excedrin Migraine without much benefit.  The patient reports no significant numbness or weakness of the extremities, he may occasionally have some tingling in the left arm.  He denies any severe dizziness, his balance is slightly off at times, he denies any falls.  He denies issues controlling the bowels or the bladder.  He does have some mild troubles with insomnia at times.  He comes to this office for an evaluation.  He denies any significant allergy symptoms or sinus drainage.  He drinks 2 caffeinated soft drinks daily, he also drinks tea.  REVIEW OF SYSTEMS: Full 14 system review of systems performed and notable only for those listed, all others are neg:  Constitutional: neg  Cardiovascular: neg Ear/Nose/Throat: neg  Skin: neg Eyes: neg Respiratory: neg Gastroitestinal: neg  Hematology/Lymphatic: neg  Endocrine: neg Musculoskeletal:neg Allergy/Immunology: neg Neurological: Headache Psychiatric: Anxiety disorder Sleep : neg   ALLERGIES: No Known Allergies  HOME MEDICATIONS: Outpatient Medications Prior to Visit  Medication Sig Dispense Refill  . pantoprazole (PROTONIX) 40 MG tablet Take 40 mg by mouth daily.  1  . rizatriptan (MAXALT) 10 MG tablet Take 1 tablet (10 mg total) by mouth 3 (three) times daily as needed for migraine. 10  tablet 4  . Topiramate ER (TROKENDI XR) 50 MG CP24 Take by mouth.    . venlafaxine (EFFEXOR) 75 MG tablet Take 1 tablet (75 mg total) by mouth daily. 30 tablet 4   No facility-administered medications prior to visit.     PAST MEDICAL HISTORY: Past Medical History:  Diagnosis Date  . Allergy   . Common migraine with intractable migraine 05/07/2018  . Depression   . Essential tremor   . Panic attacks     PAST SURGICAL HISTORY: Past Surgical History:  Procedure Laterality  Date  . FINGER SURGERY Left   . MINOR NAILBED REPAIR Left 09/13/2017   Procedure: IRRIGATION AND DEBRIDEMENT WITH REPAIR  LEFT INDEX FINGER;  Surgeon: Betha LoaKuzma, Kevin, MD;  Location: [facility];  Service: Orthopedics;  Laterality: Left;  . WRIST SURGERY  2012   cyst removed    FAMILY HISTORY: Family History  Problem Relation Age of Onset  . Hypertension Father   . Migraines Sister   . Heart attack Other     SOCIAL HISTORY: Social History   Socioeconomic History  . Marital status: Single    Spouse name: Not on file  . Number of children: 0  . Years of education: in college  . Highest education level: Not on file  Occupational History  . Occupation: Psychologist, [employer]. [employer]  Social Needs  . Financial resource strain: Not on file  . Food insecurity:    Worry: Not on file    Inability: Not on file  . Transportation needs:    Medical: Not on file    Non-medical: Not on file  Tobacco Use  . Smoking status: Never Smoker  . Smokeless tobacco: Never Used  Substance and Sexual Activity  . Alcohol use: No  . Drug use: Yes    Types: Marijuana  . Sexual activity: Not on file    Comment: Attending community college with plans to transfer to 4 year institution.  Lifestyle  . Physical activity:    Days per week: Not on file    Minutes per session: Not on file  . Stress: Not on file  Relationships  . Social connections:    Talks on phone: Not on file    Gets together: Not on file    Attends religious service: Not on file    Active member of club or organization: Not on file    Attends meetings of clubs or organizations: Not on file    Relationship status: Not on file  . Intimate partner violence:    Fear of current or ex partner: Not on file    Emotionally abused: Not on file    Physically abused: Not on file    Forced sexual activity: Not on file  Other Topics Concern  . Not on file  Social History Narrative   Lives   Caffeine use:      PHYSICAL  EXAM  There were no vitals filed for this visit. There is no height or weight on file to calculate BMI.  Generalized: Well developed, in no acute distress  Head: normocephalic and atraumatic,. Oropharynx benign  Neck: Supple,  Musculoskeletal: No deformity  Skin no rash or edema Neurological examination   Mentation: Alert oriented to time, place, history taking. Attention span and concentration appropriate. Recent and remote memory intact.  Follows all commands speech and language fluent.   Cranial nerve II-XII: .Pupils were equal round reactive to light extraocular movements were full, visual field were full on confrontational  test. Facial sensation and strength were normal. hearing was intact to finger rubbing bilaterally. Uvula tongue midline. head turning and shoulder shrug were normal and symmetric.Tongue protrusion into cheek strength was normal. Motor: normal bulk and tone, full strength in the BUE, BLE, fine finger movements normal, no pronator drift. No focal weakness Sensory: normal and symmetric to light touch, in the upper and lower extremities Coordination: finger-nose-finger, heel-to-shin bilaterally, no dysmetria Reflexes: Brachioradialis 2/2, biceps 2/2, triceps 2/2, patellar 2/2, Achilles 2/2, plantar responses were flexor bilaterally. Gait and Station: Rising up from seated position without assistance, normal stance,  moderate stride, good arm swing, smooth turning, able to perform tiptoe, and heel walking without difficulty. Tandem gait is steady  DIAGNOSTIC DATA (LABS, IMAGING, TESTING) - I reviewed patient records, labs, notes, testing and imaging myself where available.  Lab Results  Component Value Date   WBC 7.0 04/10/2018   HGB 13.9 04/10/2018   HCT 40.5 04/10/2018   MCV 89.4 04/10/2018   PLT 183 04/10/2018      Component Value Date/Time   NA 139 04/10/2018 0035   K 3.3 (L) 04/10/2018 0035   CL 103 04/10/2018 0035   CO2 28 04/10/2018 0035   GLUCOSE 106  (H) 04/10/2018 0035   BUN 12 04/10/2018 0035   CREATININE 1.01 04/10/2018 0035   CREATININE 0.81 03/18/2015 1137   CALCIUM 9.0 04/10/2018 0035   PROT 7.1 01/10/2016 0125   ALBUMIN 4.4 01/10/2016 0125   AST 32 01/10/2016 0125   ALT 23 01/10/2016 0125   ALKPHOS 78 01/10/2016 0125   BILITOT 1.5 (H) 01/10/2016 0125   GFRNONAA >60 04/10/2018 0035   GFRNONAA >89 03/18/2015 1137   GFRAA >60 04/10/2018 0035   GFRAA >89 03/18/2015 1137    Lab Results  Component Value Date   TSH 2.247 03/18/2015      ASSESSMENT AND PLAN  23 y.o. year old male  has a past medical history of Common migraine with intractable migraine (05/07/2018), Depression, Essential tremor, and Panic attacks. here to follow-up for his migraine headaches and anxiety disorder.   PLAN: Continue Effexor 75 mg daily Continue Maxalt 10 mg acutely Start topiramate 50 mg ER daily prescribed by primary care Given list of migraine triggers that are foods and reviewed these with patient Limit over-the-counter drugs due to rebound Follow-up in 4 months Nilda Riggs, [facility], [facility], APRN  [facility] [address], [address] [place], [place] [address] [phone]

## 2018-12-30 ENCOUNTER — Ambulatory Visit: Payer: BLUE CROSS/BLUE SHIELD | Admitting: Nurse Practitioner

## 2018-12-30 ENCOUNTER — Telehealth: Payer: Self-pay | Admitting: *Deleted

## 2018-12-30 NOTE — Telephone Encounter (Signed)
No showed for appt today. 

## 2018-12-31 ENCOUNTER — Encounter: Payer: Self-pay | Admitting: Nurse Practitioner

## 2019-05-23 ENCOUNTER — Emergency Department (HOSPITAL_COMMUNITY): Payer: Self-pay

## 2019-05-23 ENCOUNTER — Encounter (HOSPITAL_COMMUNITY): Payer: Self-pay | Admitting: Emergency Medicine

## 2019-05-23 ENCOUNTER — Other Ambulatory Visit: Payer: Self-pay

## 2019-05-23 ENCOUNTER — Emergency Department (HOSPITAL_COMMUNITY)
Admission: EM | Admit: 2019-05-23 | Discharge: 2019-05-23 | Discharge status: Against Medical Advice | Payer: Self-pay | Attending: Emergency Medicine | Admitting: Emergency Medicine

## 2019-05-23 DIAGNOSIS — R0789 Other chest pain: Secondary | ICD-10-CM | POA: Insufficient documentation

## 2019-05-23 DIAGNOSIS — Z79899 Other long term (current) drug therapy: Secondary | ICD-10-CM | POA: Insufficient documentation

## 2019-05-23 DIAGNOSIS — R079 Chest pain, unspecified: Secondary | ICD-10-CM

## 2019-05-23 DIAGNOSIS — Z5320 Procedure and treatment not carried out because of patient's decision for unspecified reasons: Secondary | ICD-10-CM | POA: Insufficient documentation

## 2019-05-23 DIAGNOSIS — F419 Anxiety disorder, unspecified: Secondary | ICD-10-CM | POA: Insufficient documentation

## 2019-05-23 LAB — CBC
HCT: 45 % (ref 39.0–52.0)
Hemoglobin: 15.5 g/dL (ref 13.0–17.0)
MCH: 31.1 pg (ref 26.0–34.0)
MCHC: 34.4 g/dL (ref 30.0–36.0)
MCV: 90.4 fL (ref 80.0–100.0)
Platelets: 134 10*3/uL — ABNORMAL LOW (ref 150–400)
RBC: 4.98 MIL/uL (ref 4.22–5.81)
RDW: 12 % (ref 11.5–15.5)
WBC: 4.2 10*3/uL (ref 4.0–10.5)
nRBC: 0 % (ref 0.0–0.2)

## 2019-05-23 LAB — TROPONIN I (HIGH SENSITIVITY): Troponin I (High Sensitivity): 3 ng/L (ref <18)

## 2019-05-23 LAB — BASIC METABOLIC PANEL
Anion gap: 8 (ref 5–15)
BUN: 10 mg/dL (ref 6–20)
CO2: 26 mmol/L (ref 22–32)
Calcium: 9.6 mg/dL (ref 8.9–10.3)
Chloride: 105 mmol/L (ref 98–111)
Creatinine, Ser: 0.84 mg/dL (ref 0.61–1.24)
GFR calc Af Amer: >60 mL/min (ref >60)
GFR calc non Af Amer: >60 mL/min (ref >60)
Glucose, Bld: 101 mg/dL — ABNORMAL HIGH (ref 70–99)
Potassium: 3.6 mmol/L (ref 3.5–5.1)
Sodium: 139 mmol/L (ref 135–145)

## 2019-05-23 MED ORDER — SODIUM CHLORIDE 0.9% FLUSH: 3.0000 mL | Freq: Once | INTRAVENOUS | Status: DC

## 2019-05-23 MED ORDER — HYDROXYZINE HCL 25 MG PO TABS
25.0000 mg | ORAL_TABLET | Freq: Once | ORAL | Status: AC
  Administered 2019-05-23: 25 mg via ORAL
  Filled 2019-05-23: qty 1

## 2019-05-23 NOTE — ED Notes (Signed)
Went into patient room to find leads laying on floor and patient is gone. Pt did not inform anyone he was leaving and no staff member saw this patient leave. PA Ford made aware. Looked for pt in bathroom and lobby unable to locate- pt eloped.

## 2019-05-23 NOTE — ED Provider Notes (Signed)
[facility] EMERGENCY DEPARTMENT Provider Note   CSN: [id number] Arrival date & time: 05/23/19  1112    History   Chief Complaint Chief Complaint  Patient presents with  . Chest Pain    HPI Wyatt Alexander is a 23 y.o. male.     Wyatt Alexander is a 23 y.o. male with a history of anxiety, panic attacks, depression and migraines, who presents to the ED for evaluation of chest tightness.  He reports symptoms started at about 930 this morning.  He reports that he has been under a lot of stress recently and got very worked up this morning and then started to experience some chest tightness and pressure in the center of his chest with some occasional sharp pains.  He reports since then symptoms have been constant.  Not worsening.  He denies any radiation of pain.  Pain is not exertional.  He reports pain may be slightly worse when he takes a deep breath but he does not feel significantly short of breath.  He denies any associated lightheadedness or syncope.  No abdominal pain, nausea, vomiting or diaphoresis.  No lower extremity swelling or pain, no history of DVT or PE, no recent long distance surgery or travel.  Patient denies any alcohol or drug use.  Reports that he is been feeling increasingly anxious and wonders if this may be contributing to his symptoms he has experienced similar more mild symptoms in the past associated with his anxiety.  No previous or family history of ACS.  Denies smoking, no other cardiac risk factors.  Denies cocaine or other drug use.     Past Medical History:  Diagnosis Date  . Allergy   . Common migraine with intractable migraine 05/07/2018  . Depression   . Essential tremor   . Panic attacks     Patient Active Problem List   Diagnosis Date Noted  . Anxiety disorder 08/27/2018  . Common migraine with intractable migraine 05/07/2018  . Sprain of unspecified ligament of right ankle, initial encounter 09/26/2016  . Essential tremor   .  Allergy   . Concussion 03/24/2012    Past Surgical History:  Procedure Laterality Date  . FINGER SURGERY Left   . MINOR NAILBED REPAIR Left 09/13/2017   Procedure: IRRIGATION AND DEBRIDEMENT WITH REPAIR  LEFT INDEX FINGER;  Surgeon: Leanora Cover, MD;  Location: [facility];  Service: Orthopedics;  Laterality: Left;  . WRIST SURGERY  2012   cyst removed        Home Medications    Prior to Admission medications   Medication Sig Start Date End Date Taking? Authorizing Provider  pantoprazole (PROTONIX) 40 MG tablet Take 40 mg by mouth daily. 08/13/18   [provider]  rizatriptan (MAXALT) 10 MG tablet Take 1 tablet (10 mg total) by mouth 3 (three) times daily as needed for migraine. 08/27/18   Dennie Bible, NP  Topiramate ER (TROKENDI XR) 50 MG CP24 Take by mouth. 08/13/18   [provider]  venlafaxine (EFFEXOR) 75 MG tablet Take 1 tablet (75 mg total) by mouth daily. 08/27/18   Dennie Bible, NP    Family History Family History  Problem Relation Age of Onset  . Hypertension Father   . Migraines Sister   . Heart attack Other     Social History Social History   Tobacco Use  . Smoking status: Never Smoker  . Smokeless tobacco: Never Used  Substance Use Topics  . Alcohol use: No  .  Drug use: Yes    Types: Marijuana     Allergies   Patient has no known allergies.   Review of Systems Review of Systems  Constitutional: Negative for chills and fever.  HENT: Negative.   Respiratory: Positive for chest tightness. Negative for cough and shortness of breath.   Cardiovascular: Positive for chest pain. Negative for palpitations and leg swelling.  Gastrointestinal: Negative for abdominal pain, nausea and vomiting.  Genitourinary: Negative for dysuria and frequency.  Musculoskeletal: Negative for arthralgias and myalgias.  Skin: Negative for color change and rash.  Neurological: Negative for dizziness, syncope, weakness,  light-headedness and numbness.  Psychiatric/Behavioral: The patient is nervous/anxious.      Physical Exam Updated Vital Signs BP 123/63   Pulse 86   Temp 98.6 F (37 C)   Resp 18   SpO2 98%   Physical Exam Vitals signs and nursing note reviewed.  Constitutional:      General: He is not in acute distress.    Appearance: He is well-developed and normal weight. He is not diaphoretic.     Comments: Patient appears anxious, slightly tearful.  HENT:     Head: Normocephalic and atraumatic.  Eyes:     General:        Right eye: No discharge.        Left eye: No discharge.     Pupils: Pupils are equal, round, and reactive to light.  Neck:     Musculoskeletal: Neck supple.  Cardiovascular:     Rate and Rhythm: Normal rate and regular rhythm.     Pulses:          Radial pulses are 2+ on the right side and 2+ on the left side.       Dorsalis pedis pulses are 2+ on the right side and 2+ on the left side.     Heart sounds: Normal heart sounds. No murmur. No friction rub. No gallop.   Pulmonary:     Effort: Pulmonary effort is normal. No respiratory distress.     Breath sounds: Normal breath sounds. No wheezing or rales.     Comments: Respirations equal and unlabored, patient able to speak in full sentences, lungs clear to auscultation bilaterally Chest:     Chest wall: No tenderness.  Abdominal:     General: Bowel sounds are normal. There is no distension.     Palpations: Abdomen is soft. There is no mass.     Tenderness: There is no abdominal tenderness. There is no guarding.  Musculoskeletal:        General: No deformity.  Skin:    General: Skin is warm and dry.     Capillary Refill: Capillary refill takes less than 2 seconds.  Neurological:     Mental Status: He is alert.     Coordination: Coordination normal.     Comments: Speech is clear, able to follow commands Moves extremities without ataxia, coordination intact   Psychiatric:        Attention and Perception: He  does not perceive auditory or visual hallucinations.        Mood and Affect: Mood is anxious.        Thought Content: Thought content does not include homicidal or suicidal ideation.      ED Treatments / Results  Labs (all labs ordered are listed, but only abnormal results are displayed) Labs Reviewed  BASIC METABOLIC PANEL - Abnormal; Notable for the following components:      Result Value  Glucose, Bld 101 (*)    All other components within normal limits  CBC - Abnormal; Notable for the following components:   Platelets 134 (*)    All other components within normal limits  TROPONIN I (HIGH SENSITIVITY)  TROPONIN I (HIGH SENSITIVITY)    EKG EKG Interpretation  Date/Time:  Friday May 23 2019 11:16:19 EDT Ventricular Rate:  67 PR Interval:  212 QRS Duration: 94 QT Interval:  358 QTC Calculation: 378 R Axis:   92 Text Interpretation:  Sinus rhythm with 1st degree A-V block Rightward axis Borderline ECG no significant change since 2019 Confirmed by Pricilla LovelessGoldston, Scott [phone]([id number]) on 05/23/2019 1:20:17 PM   Radiology Dg Chest 2 View  Result Date: 05/23/2019 CLINICAL DATA:  Mid to left lower chest pain since this morning. Cough. Shortness of breath. EXAM: CHEST - 2 VIEW COMPARISON:  04/10/2018. FINDINGS: The heart size and mediastinal contours are within normal limits. Both lungs are clear. The visualized skeletal structures are unremarkable. IMPRESSION: Normal examination. Electronically Signed   By: Beckie SaltsSteven  Reid M.D.   On: 05/23/2019 11:41    Procedures Procedures (including critical care time)  Medications Ordered in ED Medications  sodium chloride flush (NS) 0.9 % injection 3 mL (has no administration in time range)  hydrOXYzine (ATARAX/VISTARIL) tablet 25 mg (25 mg Oral Given 05/23/19 1232)     Initial Impression / Assessment and Plan / ED Course  I have reviewed the triage vital signs and the nursing notes.  Pertinent labs & imaging results that were available during my  care of the patient were reviewed by me and considered in my medical decision making (see chart for details).  Patient presents with pain in the chest described as tightness and pressure that started this morning.  He reports symptoms started after he got very worked up and upset and reports feeling very anxious, history of anxiety and panic attacks.  Has had similar more mild symptoms in the past.  No history of ACS, and symptoms seem atypical for this, pain is nonexertional and nonradiating.  Pain is not pleuritic in nature and patient is PERC negative, highly doubt PE.  He is not having any associated abdominal symptoms, nausea or vomiting.  He has no risk factors for aortic dissection and story sounds atypical.  Chest x-ray and labs ordered from triage which are reassuring, chest x-ray shows no evidence of pneumonia or other active cardiopulmonary disease  Labs returned and show no leukocytosis and normal hemoglobin, no acute electrolyte derangements and initial troponin is negative.  EKG shows sinus rhythm with a first degree AV block and there is no significant change since 2019.  Hydroxyzine given for anxiety, will reevaluate patient after this medication.  1:20 PM went in to reassess patient and update on reassuring labs and patient was not in room and found nowhere else in the department, or waiting room.  Patient appears to have eloped.  Attempted to call patient to discuss his reassuring results, no answer.  Final Clinical Impressions(s) / ED Diagnoses   Final diagnoses:  Chest pain, unspecified type  Anxiety    ED Discharge Orders    None       Dartha LodgeFord,  N, [place]PA-C 05/23/19 1416    Pricilla LovelessGoldston, Scott, MD 05/26/19 [id number]

## 2019-05-23 NOTE — ED Notes (Signed)
During assessment of patient, he mentioned that he became increasingly stressed this morning and then the chest pain increased.  Patient discussed things have been stressful and then this morning he felt like he got overwhelmed and became concerned.

## 2019-11-28 ENCOUNTER — Ambulatory Visit (HOSPITAL_COMMUNITY)
Admission: RE | Admit: 2019-11-28 | Discharge: 2019-11-28 | Disposition: A | Discharge status: Home / Self Care | Payer: BC Managed Care – PPO | Attending: Psychiatry | Admitting: Psychiatry

## 2019-11-28 ENCOUNTER — Encounter (HOSPITAL_COMMUNITY): Payer: Self-pay | Admitting: Psychiatry

## 2019-11-28 DIAGNOSIS — F329 Major depressive disorder, single episode, unspecified: Secondary | ICD-10-CM | POA: Insufficient documentation

## 2019-11-28 DIAGNOSIS — F341 Dysthymic disorder: Secondary | ICD-10-CM | POA: Diagnosis not present

## 2019-11-28 NOTE — H&P (Signed)
Behavioral Health Medical Screening Exam  Wyatt Alexander is an 24 y.o. male.  Total Time spent with patient: 30 minutes  Psychiatric Specialty Exam: Physical Exam  Nursing note and vitals reviewed. Constitutional: He is oriented to person, place, and time. He appears well-developed and well-nourished.  Respiratory: Effort normal.  Musculoskeletal:        General: Normal range of motion.  Neurological: He is alert and oriented to person, place, and time.  Skin: Skin is warm.    Review of Systems  Constitutional: Negative.   HENT: Negative.   Eyes: Negative.   Respiratory: Negative.   Cardiovascular: Negative.   Gastrointestinal: Negative.   Genitourinary: Negative.   Musculoskeletal: Negative.   Skin: Negative.   Neurological: Negative.   Psychiatric/Behavioral: Positive for dysphoric mood.    Blood pressure 130/88, pulse (!) 59, temperature 98 F (36.7 C), temperature source Oral, resp. rate 16, SpO2 98 %.There is no height or weight on file to calculate BMI.  General Appearance: Casual  Eye Contact:  Good  Speech:  Clear and Coherent and Normal Rate  Volume:  Decreased  Mood:  Depressed  Affect:  Depressed  Thought Process:  Coherent and Descriptions of Associations: Intact  Orientation:  Full (Time, Place, and Person)  Thought Content:  WDL  Suicidal Thoughts:  No  Homicidal Thoughts:  No  Memory:  Immediate;   Good Recent;   Good Remote;   Good  Judgement:  Fair  Insight:  Fair  Psychomotor Activity:  Normal  Concentration: Concentration: Good  Recall:  Good  Fund of Knowledge:Good  Language: Good  Akathisia:  No  Handed:  Right  AIMS (if indicated):     Assets:  Communication Skills Desire for Improvement Financial Resources/Insurance Housing Physical Health Social Support Transportation  Sleep:       Musculoskeletal: Strength & Muscle Tone: within normal limits Gait & Station: normal Patient leans: N/A  Blood pressure 130/88, pulse (!) 59,  temperature 98 F (36.7 C), temperature source Oral, resp. rate 16, SpO2 98 %.  Recommendations:  Based on my evaluation the patient does not appear to have an emergency medical condition.  Gerlene Burdock Timberly Yott, FNP 11/28/2019, 1:24 PM
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
## 2019-12-01 ENCOUNTER — Telehealth (HOSPITAL_COMMUNITY): Payer: Self-pay | Admitting: Professional

## 2019-12-09 ENCOUNTER — Ambulatory Visit (INDEPENDENT_AMBULATORY_CARE_PROVIDER_SITE_OTHER): Payer: BC Managed Care – PPO | Admitting: Licensed Clinical Social Worker

## 2019-12-09 ENCOUNTER — Encounter (HOSPITAL_COMMUNITY): Payer: Self-pay | Admitting: Licensed Clinical Social Worker

## 2019-12-09 ENCOUNTER — Other Ambulatory Visit: Payer: Self-pay

## 2019-12-09 DIAGNOSIS — F331 Major depressive disorder, recurrent, moderate: Secondary | ICD-10-CM
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
## 2019-12-15 ENCOUNTER — Ambulatory Visit (HOSPITAL_COMMUNITY): Payer: BC Managed Care – PPO | Admitting: Psychiatry

## 2019-12-15 ENCOUNTER — Other Ambulatory Visit: Payer: Self-pay

## 2019-12-18 ENCOUNTER — Ambulatory Visit (INDEPENDENT_AMBULATORY_CARE_PROVIDER_SITE_OTHER): Payer: BC Managed Care – PPO | Admitting: Licensed Clinical Social Worker

## 2019-12-18 ENCOUNTER — Other Ambulatory Visit: Payer: Self-pay

## 2019-12-18 ENCOUNTER — Encounter (HOSPITAL_COMMUNITY): Payer: Self-pay | Admitting: Licensed Clinical Social Worker

## 2019-12-18 DIAGNOSIS — F331 Major depressive disorder, recurrent, moderate: Secondary | ICD-10-CM

## 2019-12-18 NOTE — Progress Notes (Signed)
Virtual Visit via Video Note  I connected with Wyatt Alexander on 12/18/19 at  4:00 PM EST by a video enabled telemedicine application and verified that I am speaking with the correct person using two identifiers.   I discussed the limitations of evaluation and management by telemedicine and the availability of in person appointments. The patient expressed understanding and agreed to proceed.  History of Present Illness: Patient was referred to [facility] OP after presenting to [facility] for suicidal thoughts. GPD brought patient to [facility].    Observations/Objective: Patient presents for his initial individual counseling session. Patient discussed his psychiatric symptoms and current life events. Spent a considerable amount of time building a trusting, therapeutic relationship. Patient continues to struggle with the loss of his relationship. He tearfully described his ruminating, negative thoughts continuing. Used CBT to assist patient with reframing negative thoughts. Discussed coping skills: gratitudes, drawing, journaling.    Assessment and Plan: Counselor will continue to meet with patient to address treatment plan goals. Patient will continue to follow recommendations of providers and implement skills learned in session.   Follow Up Instructions: I discussed the assessment and treatment plan with the patient. The patient was provided an opportunity to ask questions and all were answered. The patient agreed with the plan and demonstrated an understanding of the instructions.   The patient was advised to call back or seek an in-person evaluation if the symptoms worsen or if the condition fails to improve as anticipated.  I provided 60 minutes of non-face-to-face time during this encounter.   Caylynn Minchew S, LCAS

## 2019-12-22 ENCOUNTER — Ambulatory Visit (HOSPITAL_COMMUNITY): Payer: BC Managed Care – PPO | Admitting: Licensed Clinical Social Worker

## 2019-12-22 ENCOUNTER — Telehealth (HOSPITAL_COMMUNITY): Payer: Self-pay | Admitting: Licensed Clinical Social Worker

## 2019-12-22 ENCOUNTER — Other Ambulatory Visit: Payer: Self-pay

## 2019-12-22 NOTE — Progress Notes (Unsigned)
Virtual Visit via Video Note  I connected with Wyatt Alexander on 12/22/19 at  2:00 PM EST by a video enabled telemedicine application and verified that I am speaking with the correct person using two identifiers.   I discussed the limitations of evaluation and management by telemedicine and the availability of in person appointments. The patient expressed understanding and agreed to proceed.  History of Present Illness: Patient was referred to [facility] OP after presenting to [facility] for suicidal thoughts. GPD brought patient to [facility].    Observations/Objective: Patient presents for his initial individual counseling session. Patient discussed his psychiatric symptoms and current life events. Spent a considerable amount of time building a trusting, therapeutic relationship. Patient continues to struggle with the loss of his relationship. He tearfully described his ruminating, negative thoughts continuing. Used CBT to assist patient with reframing negative thoughts. Discussed coping skills: gratitudes, drawing, journaling.    Assessment and Plan: Counselor will continue to meet with patient to address treatment plan goals. Patient will continue to follow recommendations of providers and implement skills learned in session.   Follow Up Instructions: I discussed the assessment and treatment plan with the patient. The patient was provided an opportunity to ask questions and all were answered. The patient agreed with the plan and demonstrated an understanding of the instructions.   The patient was advised to call back or seek an in-person evaluation if the symptoms worsen or if the condition fails to improve as anticipated.  I provided 60 minutes of non-face-to-face time during this encounter.   Jareth Pardee S, LCAS

## 2019-12-22 NOTE — Telephone Encounter (Signed)
Patient did not present for webex individual therapy session. Sent reminder email. Patient did not join. Wyatt Alexander, LCAS

## 2019-12-29 ENCOUNTER — Ambulatory Visit (INDEPENDENT_AMBULATORY_CARE_PROVIDER_SITE_OTHER): Payer: BC Managed Care – PPO | Admitting: Licensed Clinical Social Worker

## 2019-12-29 ENCOUNTER — Other Ambulatory Visit: Payer: Self-pay

## 2019-12-29 ENCOUNTER — Encounter (HOSPITAL_COMMUNITY): Payer: Self-pay | Admitting: Licensed Clinical Social Worker

## 2019-12-29 DIAGNOSIS — F331 Major depressive disorder, recurrent, moderate: Secondary | ICD-10-CM | POA: Diagnosis not present

## 2019-12-29 NOTE — Progress Notes (Signed)
Virtual Visit via Video Note  I connected with Wyatt Alexander on 12/29/19 at  2:00 PM EST by a video enabled telemedicine application and verified that I am speaking with the correct person using two identifiers.   I discussed the limitations of evaluation and management by telemedicine and the availability of in person appointments. The patient expressed understanding and agreed to proceed.  History of Present Illness: Patient was referred to [facility] OP after presenting to [facility] for suicidal thoughts. GPD brought patient to [facility].    Observations/Objective: Patient presents for his individual counseling session. Patient discussed his psychiatric symptoms and current life events. Patient missed last appointment, coached patient on the importance of keeping appointments and his desire to continue therapy. Patient discussed his feelings about his continued loss of relationship. Coached patient on the process of grieving. Patient reports the coping skills he's used in the past week are mushrooms and xanax. Coached patient on more appropriate coping skills. Educated patient on radical acceptance and skill building in session. Emailed patient handout on r"eframing your thinking." Reviewed handout and will continue discussion next session. Again, discussed coping skills: gratitudes, drawing, journaling.    Assessment and Plan: Counselor will continue to meet with patient to address treatment plan goals. Patient will continue to follow recommendations of providers and implement skills learned in session.   Follow Up Instructions: I discussed the assessment and treatment plan with the patient. The patient was provided an opportunity to ask questions and all were answered. The patient agreed with the plan and demonstrated an understanding of the instructions.   The patient was advised to call back or seek an in-person evaluation if the symptoms worsen or if the condition fails to improve as anticipated.  I provided  60 minutes of non-face-to-face time during this encounter.   Kerolos Nehme S, LCAS

## 2020-01-02 ENCOUNTER — Ambulatory Visit (INDEPENDENT_AMBULATORY_CARE_PROVIDER_SITE_OTHER): Payer: BC Managed Care – PPO | Admitting: Psychiatry

## 2020-01-02 ENCOUNTER — Encounter (HOSPITAL_COMMUNITY): Payer: Self-pay | Admitting: Psychiatry

## 2020-01-02 ENCOUNTER — Other Ambulatory Visit: Payer: Self-pay

## 2020-01-02 VITALS — Wt 175.0 lb

## 2020-01-02 DIAGNOSIS — F419 Anxiety disorder, unspecified: Secondary | ICD-10-CM | POA: Diagnosis not present

## 2020-01-02 DIAGNOSIS — F1210 Cannabis abuse, uncomplicated: Secondary | ICD-10-CM | POA: Diagnosis not present

## 2020-01-02 DIAGNOSIS — F39 Unspecified mood [affective] disorder: Secondary | ICD-10-CM

## 2020-01-02 DIAGNOSIS — F3162 Bipolar disorder, current episode mixed, moderate: Secondary | ICD-10-CM | POA: Diagnosis not present

## 2020-01-02 MED ORDER — HYDROXYZINE PAMOATE 25 MG PO CAPS: 25.0000 mg | ORAL_CAPSULE | Freq: Every day | ORAL | 0 refills | Status: AC | PRN

## 2020-01-02 MED ORDER — ARIPIPRAZOLE 5 MG PO TABS: ORAL_TABLET | ORAL | 1 refills | Status: DC

## 2020-01-02 NOTE — Progress Notes (Signed)
Virtual Visit via Video Note  I connected with Wyatt Alexander on 01/02/20 at 10:00 AM EST by a video enabled telemedicine application and verified that I am speaking with the correct person using two identifiers.   I discussed the limitations of evaluation and management by telemedicine and the availability of in person appointments. The patient expressed understanding and agreed to proceed.   [facility] Initial Assessment Note  Wyatt Alexander [id number] 23 y.o.  01/02/2020 10:46 AM  Chief Complaint:  I was referred by my therapist because I think I need medication.  History of Present Illness:  Wyatt Alexander 24 year old Caucasian, single, employed man who Alexander referred from [facility] for medication management.  Patient was earlier had evaluation at [place] brought in by [facility] police because patient was having severe depression, suicidal thoughts and feeling hopeless.  He had a broke up with his girlfriend few weeks ago.  He did not meet criteria for inpatient and recommended to see therapist.  Patient reported that he Alexander struggling with symptoms of irritability, anger, severe mood swing, depression, passive and chronic suicidal thoughts for more than 2 years.  His symptoms started to get worse recently which he believes due to having a difficult relationship.  Patient reported that he gets easily frustrated when people do not understand him.  He endorsed history of severe anger resulting punching mirrors, cutting his hand, lash out, being aggressive and then followed to severe depression.  He had a history of cutting himself superficially but never requires any medical help.  He admitted he has issues with the boundaries in his relationship.  He reported his relationship last more than a year but he also endorsed history of failed relationship because of his anger issues.  He admitted sometimes he feels reassurance about his identity and feeling wellbeing.  He mentioned people have  reported that he gets irritable and angry for no reason.  Patient also believe that he Alexander a special person and have special talents.  He reported his mood fluctuates as his appetite and sometimes he has difficulty sleeping and he thinks about everything.  Though he regret about break-up but he felt that it may be best for him.  Lately he noticed that he has no motivation to do things.  He stays to himself most of the time.  He reported feeling hopeless, helpless and worthless.  There are days when he does not eat and so far he has lost 80 pounds in past few years.  Cecille Rubin denies any hallucination but reported ringing in the ear which could be due to tinnitus.  He also reported paranoia and sometimes difficulty trusting people.  He reported chronic anxiety because he Alexander not sure about his future.  He admitted smoking marijuana on a daily basis to calm down and he also took Xanax 3 mg from the street to calm him down when he was angry but having withdrawal symptoms when he could not get it.  There history of drinking alcohol but now he could not tolerate drinking and he decided to stop.  He reported having issues with people especially given the relationship.  He does not get along with mother and gets irritated easily.  However he reported getting along with younger sister and father.  He Alexander working in a hotel.  He admitted dropout from the college because he has no motivation to finish the school.  In the past he recall seeing psychiatrist and therapist at [facility] and prescribed hydroxyzine and Lupron  but he was never consistent with the medication.  Denies any nightmares, flashback, OCD or any panic attacks.  He Alexander open to try medication to calm his mood irritability anger depression.     Past Psychiatric History: History of irritability, anger, mood swing, chronic depression or anxiety.  Seen briefly at [facility] and prescribed bupropion and hydroxyzine but not can do the medication.  History of cutting  superficially that does not require medical needs.  History of chronic depression and passive and fleeting suicidal thoughts.  History of drinking, cannabis use, mushroom and using Xanax from the streets.  Neurologist prescribed Effexor for headache and anxiety but never took it.  Family History:  Patient reported undiagnosed mental problems in the family.  Older sister Alexander diagnosed with borderline personality disorder.  Past Medical History:  Diagnosis Date  . Allergy   . Common migraine with intractable migraine 05/07/2018  . Depression   . Essential tremor   . Panic attacks      Traumatic brain injury: Denies any history of traumatic brain injury.  Work History; Working in a hotel.  However finish college and dropped out.  Remember not good grades in high school.  Psychosocial History; Patient lives with his parents and younger twins.  Patient reported multiple failed relationship because of this anger issues.  Patient recently had a break-up with his girlfriend and currently not in any new relationship.  Legal History; Denies any current legal problems.  History Of Abuse; Denies any history of physical sexual abuse but reported seeing a lot of arguments and fights between parents.  No history of nightmares or flashback.  Substance Abuse History; History of cannabis use, using mushroom and Xanax from the streets.  History of withdrawal from Xanax.    Neurologic: Headache: No Seizure: No Paresthesias: No   Outpatient Encounter Medications as of 01/02/2020  Medication Sig  . pantoprazole (PROTONIX) 40 MG tablet Take 40 mg by mouth daily.  . [DISCONTINUED] rizatriptan (MAXALT) 10 MG tablet Take 1 tablet (10 mg total) by mouth 3 (three) times daily as needed for migraine.  . [DISCONTINUED] Topiramate ER (TROKENDI XR) 50 MG CP24 Take by mouth.  . [DISCONTINUED] venlafaxine (EFFEXOR) 75 MG tablet Take 1 tablet (75 mg total) by mouth daily.   No facility-administered encounter  medications on file as of 01/02/2020.    No results found for this or any previous visit (from the past 2160 hour(s)).    Constitutional:  There were no vitals taken for this visit.   Musculoskeletal: Strength & Muscle Tone: within normal limits Gait & Station: normal Patient leans: N/A  Psychiatric Specialty Exam: Physical Exam  ROS  There were no vitals taken for this visit.There Alexander no height or weight on file to calculate BMI.  General Appearance: Fairly Groomed and Guarded  Eye Contact:  Fair  Speech:  Slow  Volume:  Decreased  Mood:  Depressed and Irritable  Affect:  Constricted  Thought Process:  Goal Directed  Orientation:  Full (Time, Place, and Person)  Thought Content:  Paranoid Ideation and Rumination  Suicidal Thoughts:  No  Homicidal Thoughts:  No  Memory:  Immediate;   Good Recent;   Fair Remote;   Fair  Judgement:  Fair  Insight:  Fair  Psychomotor Activity:  Decreased  Concentration:  Concentration: Fair and Attention Span: Fair  Recall:  [employer] of Knowledge:  Fair  Language:  Good  Akathisia:  No  Handed:  Right  AIMS (if indicated):  Assets:  Communication Skills Desire for Improvement Housing Transportation  ADL's:  Intact  Cognition:  WNL  Sleep:   poor    Assessment and Plan: Bradon Alexander a 24 year old Caucasian man with history of depression, anxiety, cutting, anger, severe mood swing and cannabis use currently seeing therapist like to have medication management.  We discussed about possibility of bipolar disorder and he agreed with his symptoms.  I recommend to try Abilify to help his negative thoughts, paranoia ruminative thoughts, chronic depression and mood.  He agreed with the plan.  We will start 5 mg Abilify.  Will take half tablet for 1 week and then full tablet daily.  I recommend to try hydroxyzine at nighttime to help his anxiety and insomnia.  Discussed.  Cannabis use due to interaction with psychotropic medication and his  symptoms.  He Alexander currently not using Xanax and understands that substance abuse may be contributing to his symptoms.  Encouraged to continue therapy with Lavada Mesi.  Discussed medication side effect specially metabolic syndrome, EPS from Abilify.  In the past he had not consistent with bupropion, Effexor.  Discussed safety concerns and anytime having active suicidal thoughts or homicidal thought that he need to call 911 or go to local emergency room.  Follow-up in 3 weeks.  Follow Up Instructions:    I discussed the assessment and treatment plan with the patient. The patient was provided an opportunity to ask questions and all were answered. The patient agreed with the plan and demonstrated an understanding of the instructions.   The patient was advised to call back or seek an in-person evaluation if the symptoms worsen or if the condition fails to improve as anticipated.  I provided 55 minutes of non-face-to-face time during this encounter.   Cleotis Nipper, MD

## 2020-01-05 ENCOUNTER — Encounter (HOSPITAL_COMMUNITY): Payer: Self-pay | Admitting: Licensed Clinical Social Worker

## 2020-01-05 ENCOUNTER — Other Ambulatory Visit: Payer: Self-pay

## 2020-01-05 ENCOUNTER — Ambulatory Visit (INDEPENDENT_AMBULATORY_CARE_PROVIDER_SITE_OTHER): Payer: BC Managed Care – PPO | Admitting: Licensed Clinical Social Worker

## 2020-01-05 DIAGNOSIS — F419 Anxiety disorder, unspecified: Secondary | ICD-10-CM | POA: Diagnosis not present

## 2020-01-05 DIAGNOSIS — F3162 Bipolar disorder, current episode mixed, moderate: Secondary | ICD-10-CM

## 2020-01-05 DIAGNOSIS — F1210 Cannabis abuse, uncomplicated: Secondary | ICD-10-CM | POA: Diagnosis not present

## 2020-01-05 DIAGNOSIS — F39 Unspecified mood [affective] disorder: Secondary | ICD-10-CM | POA: Diagnosis not present

## 2020-01-05 NOTE — Progress Notes (Addendum)
Virtual Visit via Video Note  I connected with Wyatt Alexander on 01/05/20 at  2:00 PM EST by a video enabled telemedicine application and verified that I am speaking with the correct person using two identifiers.   I discussed the limitations of evaluation and management by telemedicine and the availability of in person appointments. The patient expressed understanding and agreed to proceed.  History of Present Illness: Patient was referred to [facility] OP after presenting to [facility] for suicidal thoughts. GPD brought patient to [facility].    Observations/Objective: Patient presents for his individual counseling session. Patient discussed his psychiatric symptoms and current life events. Patient reports he had his initial appointment with Dr. Lolly Mustache psychiatrist. Patient was dx with bipolar disorder. Emailed patient 3 informational sheets and educated patient on bipolar disorder, warning signs of and causes of disorder. Patient continues to use marijuana daily. Educated patient on potential of marijuana use interfering with his new medications. Reviewed "reframing your thinking" with patient.  PLAN: Research amotivational syndrome  Assessment and Plan: Counselor will continue to meet with patient to address treatment plan goals. Patient will continue to follow recommendations of providers and implement skills learned in session.   Follow Up Instructions: I discussed the assessment and treatment plan with the patient. The patient was provided an opportunity to ask questions and all were answered. The patient agreed with the plan and demonstrated an understanding of the instructions.   The patient was advised to call back or seek an in-person evaluation if the symptoms worsen or if the condition fails to improve as anticipated.  I provided 60 minutes of non-face-to-face time during this encounter.   [name] S, LCAS

## 2020-01-12 ENCOUNTER — Other Ambulatory Visit: Payer: Self-pay

## 2020-01-12 ENCOUNTER — Telehealth (HOSPITAL_COMMUNITY): Payer: Self-pay | Admitting: *Deleted

## 2020-01-12 ENCOUNTER — Encounter (HOSPITAL_COMMUNITY): Payer: Self-pay | Admitting: Licensed Clinical Social Worker

## 2020-01-12 ENCOUNTER — Ambulatory Visit (INDEPENDENT_AMBULATORY_CARE_PROVIDER_SITE_OTHER): Payer: BC Managed Care – PPO | Admitting: Licensed Clinical Social Worker

## 2020-01-12 DIAGNOSIS — F3162 Bipolar disorder, current episode mixed, moderate: Secondary | ICD-10-CM | POA: Diagnosis not present

## 2020-01-12 DIAGNOSIS — F39 Unspecified mood [affective] disorder: Secondary | ICD-10-CM | POA: Diagnosis not present

## 2020-01-12 DIAGNOSIS — F419 Anxiety disorder, unspecified: Secondary | ICD-10-CM | POA: Diagnosis not present

## 2020-01-12 DIAGNOSIS — F1210 Cannabis abuse, uncomplicated: Secondary | ICD-10-CM

## 2020-01-12 NOTE — Telephone Encounter (Signed)
Writer received a message for pt therapist Lavada Mesi regarding pt c/o tremors, dizziness (vertigo), headaches and feeling feverish with no fever present. Pt started Abilify 5mg  on 01/02/20. Symptoms started on 01/07/20. Pt does have h/o panic attacks per chart and has Vistaril although this sounds like he does not think that is what this is. Please review.

## 2020-01-12 NOTE — Progress Notes (Signed)
Virtual Visit via Video Note  I connected with Wyatt Alexander on 01/12/20 at  2:00 PM EST by a video enabled telemedicine application and verified that I am speaking with the correct person using two identifiers.   I discussed the limitations of evaluation and management by telemedicine and the availability of in person appointments. The patient expressed understanding and agreed to proceed.  History of Present Illness: Patient was referred to [facility] OP after presenting to [facility] for suicidal thoughts. GPD brought patient to [facility].    Observations/Objective: Patient presents for his individual counseling session. Patient discussed his psychiatric symptoms and current life events. Patient discussed his symptoms and current coping skills. Patient reports he is experiencing side effects of his medication. Sent LPN a note to let Dr. Lolly Mustache know of the side effects. Explored "reframing your thinking" with patient educating on reframing negative thoughts.    PLAN: 3 informational sheets, bipolar disorder, warning signs of and causes of disorder.   PLAN: Research amotivational syndrome  Assessment and Plan: Counselor will continue to meet with patient to address treatment plan goals. Patient will continue to follow recommendations of providers and implement skills learned in session.   Follow Up Instructions: I discussed the assessment and treatment plan with the patient. The patient was provided an opportunity to ask questions and all were answered. The patient agreed with the plan and demonstrated an understanding of the instructions.   The patient was advised to call back or seek an in-person evaluation if the symptoms worsen or if the condition fails to improve as anticipated.  I provided 60 minutes of non-face-to-face time during this encounter.   Wyatt Alexander, LCAS

## 2020-01-12 NOTE — Telephone Encounter (Signed)
Unlikely the side effects from Abilify however he can stop Abilify to see symptoms go away.  If symptoms still persist then he need to see his primary care physician.

## 2020-01-13 ENCOUNTER — Telehealth (HOSPITAL_COMMUNITY): Payer: Self-pay | Admitting: *Deleted

## 2020-01-13 NOTE — Telephone Encounter (Signed)
Writer left two VM for pt relating Dr. Lolly Mustache orders to stop Abilify and see if symptoms resolve resolve. If not, pt instructed to see PCP. Writer asked that pt call back in 2 or 3 days to give update.

## 2020-01-13 NOTE — Telephone Encounter (Signed)
Thanks for update

## 2020-01-19 ENCOUNTER — Ambulatory Visit (INDEPENDENT_AMBULATORY_CARE_PROVIDER_SITE_OTHER): Payer: BC Managed Care – PPO | Admitting: Licensed Clinical Social Worker

## 2020-01-19 ENCOUNTER — Encounter (HOSPITAL_COMMUNITY): Payer: Self-pay | Admitting: Licensed Clinical Social Worker

## 2020-01-19 ENCOUNTER — Other Ambulatory Visit: Payer: Self-pay

## 2020-01-19 DIAGNOSIS — F419 Anxiety disorder, unspecified: Secondary | ICD-10-CM | POA: Diagnosis not present

## 2020-01-19 DIAGNOSIS — F39 Unspecified mood [affective] disorder: Secondary | ICD-10-CM

## 2020-01-19 DIAGNOSIS — F3162 Bipolar disorder, current episode mixed, moderate: Secondary | ICD-10-CM

## 2020-01-19 DIAGNOSIS — F1210 Cannabis abuse, uncomplicated: Secondary | ICD-10-CM

## 2020-01-19 NOTE — Progress Notes (Signed)
Virtual Visit via Video Note  I connected with Earney Mallet on 01/19/20 at 2:15 EDT by a video enabled telemedicine application and verified that I am speaking with the correct person using two identifiers.   I discussed the limitations of evaluation and management by telemedicine and the availability of in person appointments. The patient expressed understanding and agreed to proceed.  History of Present Illness: Patient was referred to [facility] OP after presenting to [facility] for suicidal thoughts. GPD brought patient to [facility].    Observations/Objective: Patient presents depressed for his individual counseling session. Patient discussed his psychiatric symptoms and current life events. Patient discussed his symptoms and current coping skills. Patient reports he is free from side effects of the medication, stopped taking the medication per Dr. Lolly Mustache. He has appointment with Dr. Lolly Mustache Thursday, discussed what to review with psychiatrist. Began review of informational sheets on Bipolar, to continue next session.  PLAN: 3 informational sheets, bipolar disorder, warning signs of and causes of disorder.   PLAN: Research amotivational syndrome  Assessment and Plan: Counselor will continue to meet with patient to address treatment plan goals. Patient will continue to follow recommendations of providers and implement skills learned in session.   Follow Up Instructions: I discussed the assessment and treatment plan with the patient. The patient was provided an opportunity to ask questions and all were answered. The patient agreed with the plan and demonstrated an understanding of the instructions.   The patient was advised to call back or seek an in-person evaluation if the symptoms worsen or if the condition fails to improve as anticipated.  I provided 45 minutes of non-face-to-face time during this encounter.   Quana Chamberlain S, LCAS

## 2020-01-22 ENCOUNTER — Other Ambulatory Visit (HOSPITAL_COMMUNITY): Payer: Self-pay | Admitting: Psychiatry

## 2020-01-22 ENCOUNTER — Ambulatory Visit (HOSPITAL_COMMUNITY): Payer: BC Managed Care – PPO | Admitting: Psychiatry

## 2020-01-22 ENCOUNTER — Other Ambulatory Visit: Payer: Self-pay

## 2020-01-26 ENCOUNTER — Other Ambulatory Visit: Payer: Self-pay

## 2020-01-26 ENCOUNTER — Ambulatory Visit (HOSPITAL_COMMUNITY): Payer: BC Managed Care – PPO | Admitting: Licensed Clinical Social Worker

## 2020-01-28 ENCOUNTER — Ambulatory Visit (INDEPENDENT_AMBULATORY_CARE_PROVIDER_SITE_OTHER): Payer: BC Managed Care – PPO | Admitting: Licensed Clinical Social Worker

## 2020-01-28 ENCOUNTER — Other Ambulatory Visit: Payer: Self-pay

## 2020-01-28 ENCOUNTER — Encounter (HOSPITAL_COMMUNITY): Payer: Self-pay | Admitting: Licensed Clinical Social Worker

## 2020-01-28 DIAGNOSIS — F419 Anxiety disorder, unspecified: Secondary | ICD-10-CM

## 2020-01-28 DIAGNOSIS — F3162 Bipolar disorder, current episode mixed, moderate: Secondary | ICD-10-CM

## 2020-01-28 DIAGNOSIS — F1210 Cannabis abuse, uncomplicated: Secondary | ICD-10-CM

## 2020-01-28 DIAGNOSIS — F39 Unspecified mood [affective] disorder: Secondary | ICD-10-CM | POA: Diagnosis not present

## 2020-01-28 NOTE — Progress Notes (Addendum)
Virtual Visit via Video Note  I connected with Wyatt Alexander on 01/28/20 at 3:00 pm EDT by a video enabled telemedicine application and verified that I am speaking with the correct person using two identifiers.   I discussed the limitations of evaluation and management by telemedicine and the availability of in person appointments. The patient expressed understanding and agreed to proceed.  History of Present Illness: Patient was referred to [facility] OP after presenting to [facility] for suicidal thoughts. [facility] brought patient to [facility].    Observations/Objective: Patient presents agitated, depressed for his individual counseling session. Patient discussed his psychiatric symptoms and current life events. Patient discussed his symptoms and current coping skills. Patient missed his psychiatric appointment last week and suggested he call the front desk to reschedule. Since he stopped taking the medication prescribed because of side effects he is only taking Hydroxyzine. He is in a downward spiral and has cut himself once. Patient reports no S/I or H/I. He is agitated, not sleeping, exhausted, self deprecating.  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 Clinician utilized MI OARS to reflect and summarize thoughts and feelings Discussed MI with patient:t readiness to change. Will continue this discussion at next session.   Assessment and Plan: Counselor will continue to meet with patient to address treatment plan goals. Patient will continue to follow recommendations of providers and implement skills learned in session.   Follow Up Instructions: I discussed the assessment and treatment plan with the patient. The patient was provided an opportunity to ask questions and all were answered. The patient agreed with the plan and demonstrated an understanding of the instructions.   The patient was advised to call back or seek an in-person evaluation if the symptoms worsen or if the condition fails to improve as anticipated.  I  provided 60 minutes of non-face-to-face time during this encounter.   MACKENZIE,LISBETH S, LCAS

## 2020-02-02 ENCOUNTER — Ambulatory Visit (HOSPITAL_COMMUNITY): Payer: BC Managed Care – PPO | Admitting: Licensed Clinical Social Worker

## 2020-02-02 ENCOUNTER — Other Ambulatory Visit: Payer: Self-pay

## 2020-02-02 ENCOUNTER — Telehealth (HOSPITAL_COMMUNITY): Payer: Self-pay | Admitting: Licensed Clinical Social Worker

## 2020-02-02 NOTE — Telephone Encounter (Signed)
Pt did not present for his individual webex therapy appointment. Emailed patient reminder. Did not join session. Emailed pt to contact front desk to schedule additional appointments.    By Vernona Rieger, LCAS

## 2020-04-09 ENCOUNTER — Ambulatory Visit (HOSPITAL_COMMUNITY)
Admission: AD | Admit: 2020-04-09 | Discharge: 2020-04-09 | Disposition: A | Discharge status: Home / Self Care | Payer: BC Managed Care – PPO | Attending: Psychiatry | Admitting: Psychiatry

## 2020-04-09 DIAGNOSIS — F12920 Cannabis use, unspecified with intoxication, uncomplicated: Secondary | ICD-10-CM | POA: Insufficient documentation

## 2020-04-09 DIAGNOSIS — F332 Major depressive disorder, recurrent severe without psychotic features: Secondary | ICD-10-CM | POA: Insufficient documentation

## 2020-04-09 NOTE — BH Assessment (Signed)
Assessment Note  Wyatt Alexander is an 24 y.o. male presents this date   voluntarily to [facility] for assessment. Patient brought in by mobile crisis. Patient states he "isn't sure why" he is here reporting that he currently resides with his parents and had expressed frustration earlier this date over violating a restraining order on a former girlfriend and states a family member contacted mobile crisis. Patient reports he was "upset about the situation" but had no intent to harm himself. Patient denies any S/I, H/I or AVH. Patient does report a history of superficial cutting although currently denies stating he "has not done that in months." Patient was last seen per chart review on 11/28/19 when he presented with S/I after going through a break up with his girlfriend. Patient denied any plan or intent at that time. Patient denies any prior attempts or gestures at self harm. Patient did not meet inpatient criteria at that time and was instructed to follow up with [facility] OP services which he did. Patient states he currently sees Arfeen MD for medication management and Charolotte Eke for counseling. Patient states he has a appointment on Monday 04/12/20 with Arfeen MD to address medication issues. Patient states he was prescribed Abilify and Vistaril and feels they are not working. Patient denies any current symptoms although believes he "may not need them anymore." Patient reports sporadic Cannabis use stating he uses a gram two to three times a week with last use on 04/09/20 when he reported he used one half a gram. Patient states he also "drinks a few beers" from time to time with last use on 04/09/20 when he reported he "drank a couple beers." Patient resides with his family and is currently employed at [employer]. Patient is contracting for safety and states he intends on following up on Monday with his OP appointment. Patient is oriented x 4 and presents with a pleasant affect. Patient speaks in normal tone and volume with  thoughts being organized. Patient does not appear to be responding to internal stimuli. This writer attempted to contact patient's father Jaquin Coy [phone] and mother Seanpatrick Maisano [phone] unsuccessful this date. Starkes FNP also evaluated patient and recommended patient be discharged and follow up with OP appointment on Monday.    Diagnosis: MDD recurrent without psychotic features, severe, Cannabis use   Past Medical History:  Past Medical History:  Diagnosis Date  . Allergy   . Common migraine with intractable migraine 05/07/2018  . Depression   . Essential tremor   . Panic attacks     Past Surgical History:  Procedure Laterality Date  . FINGER SURGERY Left   . MINOR NAILBED REPAIR Left 09/13/2017   Procedure: IRRIGATION AND DEBRIDEMENT WITH REPAIR  LEFT INDEX FINGER;  Surgeon: Leanora Cover, MD;  Location: [place];  Service: Orthopedics;  Laterality: Left;  . WRIST SURGERY  2012   cyst removed    Family History:  Family History  Problem Relation Age of Onset  . Hypertension Father   . Migraines Sister   . Heart attack Other     Social History:  reports that he has never smoked. He has never used smokeless tobacco. He reports current drug use. Drug: Marijuana. He reports that he does not drink alcohol.  Additional Social History:  Alcohol / Drug Use Pain Medications: See MAR Prescriptions: See MAR Over the Counter: See MAR History of alcohol / drug use?: Yes Longest period of sobriety (when/how long): Unknown Negative Consequences of Use: (Denies) Withdrawal  Symptoms: (Denies) Substance #1 Name of Substance 1: Cannabis 1 - Age of First Use: 19 1 - Amount (size/oz): Varies 1 - Frequency: Varies 1 - Duration: Ongoing 1 - Last Use / Amount: 04/08/20 1 gram Substance #2 Name of Substance 2: Alcohol 2 - Age of First Use: 19 2 - Amount (size/oz): Varies 2 - Frequency: Varies 2 - Duration: Ongoing 2 - Last Use / Amount: 04/08/20 "a few  beers"  CIWA: CIWA-Ar BP: 130/76 Pulse Rate: 63 COWS:    Allergies: No Known Allergies  Home Medications: (Not in a hospital admission)   OB/GYN Status:  No LMP for male patient.  General Assessment Data Location of Assessment: [facility] Assessment Services TTS Assessment: In system Is this a Tele or Face-to-Face Assessment?: Face-to-Face Is this an Initial Assessment or a Re-assessment for this encounter?: Initial Assessment Patient Accompanied by:: N/A Language Other than English: No Living Arrangements: Other (Comment)(Parents) What gender do you identify as?: Male Date Telepsych consult ordered in [facility]: 04/09/20 Time Telepsych consult ordered in [facility]: 1534 Marital status: Single Living Arrangements: Parent Can pt return to current living arrangement?: Yes Admission Status: Voluntary Is patient capable of signing voluntary admission?: Yes Referral Source: Other Insurance type: [employer]     Crisis Care Plan Living Arrangements: Parent Legal Guardian: (NA) Name of Psychiatrist: Arfeen MD Name of Therapist: McKenzie  Education Status Is patient currently in school?: No Is the patient employed, unemployed or receiving disability?: Employed  Risk to self with the past 6 months Suicidal Ideation: No Has patient been a risk to self within the past 6 months prior to admission? : No Suicidal Intent: No Has patient had any suicidal intent within the past 6 months prior to admission? : No Is patient at risk for suicide?: No Suicidal Plan?: No Has patient had any suicidal plan within the past 6 months prior to admission? : No Access to Means: No What has been your use of drugs/alcohol within the last 12 months?: Current use Previous Attempts/Gestures: No How many times?: 0 Other Self Harm Risks: (Hx of cutting) Triggers for Past Attempts: (NA) Intentional Self Injurious Behavior: Cutting Comment - Self Injurious Behavior: Hx of cutting Family Suicide History: No Recent  stressful life event(s): Other (Comment)(Legal issues, relationship issues) Persecutory voices/beliefs?: No Depression: Yes Depression Symptoms: Guilt Substance abuse history and/or treatment for substance abuse?: No Suicide prevention information given to non-admitted patients: Not applicable  Risk to Others within the past 6 months Homicidal Ideation: No Does patient have any lifetime risk of violence toward others beyond the six months prior to admission? : No Thoughts of Harm to Others: No Current Homicidal Intent: No Current Homicidal Plan: No Access to Homicidal Means: No Identified Victim: NA History of harm to others?: No Assessment of Violence: None Noted Violent Behavior Description: NA Does patient have access to weapons?: No Criminal Charges Pending?: No Does patient have a court date: No Is patient on probation?: No  Psychosis Hallucinations: None noted Delusions: None noted  Mental Status Report Appearance/Hygiene: Unremarkable Eye Contact: Good Motor Activity: Freedom of movement Speech: Logical/coherent Level of Consciousness: Alert Mood: Pleasant Affect: Appropriate to circumstance Anxiety Level: Minimal Thought Processes: Coherent, Relevant Judgement: Partial Orientation: Person, Place, Time Obsessive Compulsive Thoughts/Behaviors: None  Cognitive Functioning Concentration: Normal Memory: Recent Intact, Remote Intact Is patient IDD: No Insight: Good Impulse Control: Fair Appetite: Good Have you had any weight changes? : No Change Sleep: No Change Total Hours of Sleep: 7 Vegetative Symptoms: None  ADLScreening (  [facility] Assessment Services) Patient's cognitive ability adequate to safely complete daily activities?: Yes Patient able to express need for assistance with ADLs?: Yes Independently performs ADLs?: Yes (appropriate for developmental age)  Prior Inpatient Therapy Prior Inpatient Therapy: No  Prior Outpatient Therapy Prior Outpatient  Therapy: Yes Prior Therapy Dates: Ongoing Prior Therapy Facilty/Provider(s): [facility] Reason for Treatment: Med mang/counseling Does patient have an ACCT team?: No Does patient have Intensive In-House Services?  : No Does patient have [facility] services? : No Does patient have P4CC services?: No  ADL Screening (condition at time of admission) Patient's cognitive ability adequate to safely complete daily activities?: Yes Is the patient deaf or have difficulty hearing?: No Does the patient have difficulty seeing, even when wearing glasses/contacts?: No Does the patient have difficulty concentrating, remembering, or making decisions?: No Patient able to express need for assistance with ADLs?: Yes Does the patient have difficulty dressing or bathing?: No Independently performs ADLs?: Yes (appropriate for developmental age) Does the patient have difficulty walking or climbing stairs?: No Weakness of Legs: None Weakness of Arms/Hands: None  Home Assistive Devices/Equipment Home Assistive Devices/Equipment: None  Therapy Consults (therapy consults require a physician order) PT Evaluation Needed: No OT Evalulation Needed: No SLP Evaluation Needed: No Abuse/Neglect Assessment (Assessment to be complete while patient is alone) Abuse/Neglect Assessment Can Be Completed: Yes Physical Abuse: Denies Verbal Abuse: Denies Sexual Abuse: Denies Exploitation of patient/patient's resources: Denies Self-Neglect: Denies Values / Beliefs Cultural Requests During Hospitalization: None Spiritual Requests During Hospitalization: None Consults Spiritual Care Consult Needed: No Transition of Care Team Consult Needed: No Advance Directives (For Healthcare) Does Patient Have a Medical Advance Directive?: No Would patient like information on creating a medical advance directive?: No - Patient declined          Disposition: Starkes FNP also evaluated patient and recommended patient be discharged and  follow up with OP appointment on Monday.  Disposition Initial Assessment Completed for this Encounter: Yes Disposition of Patient: Discharge  On Site Evaluation by:   Reviewed with Physician:    Alfredia Ferguson 04/09/2020 6:41 PM

## 2020-04-09 NOTE — H&P (Signed)
Behavioral Health Medical Screening Exam  Wyatt Alexander is an 24 y.o. male who presents per recommendation of his parents. He denies suicidal ideations, homicidal ideations and or hallucinations. At this time he states he is being compliant and at the request of his parents and mobile crisis he came to be evaluated. He is receiving outpatient services and remains compliant with therapy and medication per chart review.   Total Time spent with patient: 30 minutes  Psychiatric Specialty Exam: Physical Exam  Nursing note and vitals reviewed. Constitutional: He is oriented to person, place, and time. He appears well-developed and well-nourished.  Respiratory: Effort normal.  Musculoskeletal:        General: Normal range of motion.  Neurological: He is alert and oriented to person, place, and time.  Skin: Skin is warm.    Review of Systems  Constitutional: Negative.   HENT: Negative.   Eyes: Negative.   Respiratory: Negative.   Cardiovascular: Negative.   Gastrointestinal: Negative.   Genitourinary: Negative.   Musculoskeletal: Negative.   Skin: Negative.   Neurological: Negative.   Psychiatric/Behavioral: Positive for behavioral problems. Negative for agitation, confusion, decreased concentration, dysphoric mood, hallucinations, self-injury and sleep disturbance. The patient is not nervous/anxious and is not hyperactive.     Blood pressure 130/76, pulse 63, temperature 98.4 F (36.9 C), temperature source Oral, resp. rate 18.There is no height or weight on file to calculate BMI.  General Appearance: Casual  Eye Contact:  Good  Speech:  Clear and Coherent and Normal Rate  Volume:  Normal  Mood:  Euthymic  Affect:  Congruent  Thought Process:  Coherent and Descriptions of Associations: Intact  Orientation:  Full (Time, Place, and Person)  Thought Content:  WDL  Suicidal Thoughts:  No  Homicidal Thoughts:  No  Memory:  Immediate;   Good Recent;   Good Remote;   Good  Judgement:   Fair  Insight:  Fair  Psychomotor Activity:  Normal  Concentration: Concentration: Good  Recall:  Good  Fund of Knowledge:Good  Language: Good  Akathisia:  No  Handed:  Right  AIMS (if indicated):     Assets:  Communication Skills Desire for Improvement Financial Resources/Insurance Housing Physical Health Social Support Transportation  Sleep:       Musculoskeletal: Strength & Muscle Tone: within normal limits Gait & Station: normal Patient leans: N/A  Blood pressure 130/76, pulse 63, temperature 98.4 F (36.9 C), temperature source Oral, resp. rate 18.  Recommendations:  Based on my evaluation the patient does not appear to have an emergency medical condition. Will psych clear at this time. Attempted to contact parents for collateral however unable to reach. Patient continue to refute any negative thoughts or outward behaviors towards his ex girlfriend. He says he will remain safe and will not do harm to anyone.   Maryagnes Amos, FNP 04/09/2020, 6:30 PM

## 2020-04-12 ENCOUNTER — Other Ambulatory Visit: Payer: Self-pay

## 2020-04-12 ENCOUNTER — Telehealth (INDEPENDENT_AMBULATORY_CARE_PROVIDER_SITE_OTHER): Payer: BC Managed Care – PPO | Admitting: Psychiatry

## 2020-04-12 ENCOUNTER — Telehealth (HOSPITAL_COMMUNITY): Payer: Self-pay | Admitting: Psychiatry

## 2020-04-12 ENCOUNTER — Encounter (HOSPITAL_COMMUNITY): Payer: Self-pay | Admitting: Psychiatry

## 2020-04-12 VITALS — Wt 185.0 lb

## 2020-04-12 DIAGNOSIS — F331 Major depressive disorder, recurrent, moderate: Secondary | ICD-10-CM

## 2020-04-12 DIAGNOSIS — F39 Unspecified mood [affective] disorder: Secondary | ICD-10-CM | POA: Diagnosis not present

## 2020-04-12 DIAGNOSIS — F1210 Cannabis abuse, uncomplicated: Secondary | ICD-10-CM

## 2020-04-12 MED ORDER — OLANZAPINE-FLUOXETINE HCL 3-25 MG PO CAPS: 1.0000 | ORAL_CAPSULE | Freq: Every evening | ORAL | 0 refills | Status: AC

## 2020-04-12 NOTE — Telephone Encounter (Signed)
D:  Dr. Lolly Mustache referred pt to MH-IOP.  A:  Placed call to orient patient and provide him with a start date.  Pt states he works in the mornings, but is wanting to talk to his employer tomorrow to see if he can attend the groups and go in afterwards.  Encouraged pt to call case manager tomorrow.  Inform Dr. Lolly Mustache.  R:  Pt receptive.

## 2020-04-12 NOTE — Progress Notes (Signed)
Virtual Visit via Telephone Note  I connected with Wyatt Alexander on 04/12/20 at  1:20 PM EDT by telephone and verified that I am speaking with the correct person using two identifiers.   I discussed the limitations, risks, security and privacy concerns of performing an evaluation and management service by telephone and the availability of in person appointments. I also discussed with the patient that there may be a patient responsible charge related to this service. The patient expressed understanding and agreed to proceed.  Patient location; home Provider location; home office  History of Present Illness: Patient is evaluated by phone session.  He is a 24 year old Caucasian man who was seen first time in February 2021.  After that he canceled his appointment.  We prescribed the Abilify but he complaining of vertigo and dizziness and he stopped taking the Abilify.  Though he recall his vertigo and dizziness got better after stopping the Abilify but started to have increased depression, severe mood swings, irritability and anger.  He endorsed that he violated restraining order which was placed by his exgirlfriend and she called the police and he had to spend few days in the jail.  He has a next court date July 19.  He admitted situation is causing a lot of stress.  He admitted severe anger, outburst, started cutting his arm superficially with scissors but do not require any stitches.  He admitted not consistent with therapy.  He was recently seen in the assessment but did not meet criteria for inpatient.  He is living with his parents but now saving money and hoping to have his own place around his birthday.  He is working full-time in [employer].  He endorsed extreme anxiety, racing thoughts, poor sleep, paranoia and fluctuating of mood.  He had lost more than 80 pounds in past 2 years.  Though he denies any hallucination but endorsed ringing in the ear.  He has difficulty trusting people.  He continues  to smoke marijuana almost daily basis.  He has a history of using Xanax up to 3 mg from the streets to calm him down.  He has difficulty keeping a relationship with the people.  He had a history of dropout from the college because he has no motivation to finish school.  He is none consistent with therapy and medication.   Past Psychiatric History: H/O irritability, anger, mood swing, chronic depression and anxiety.  Saw [facility] at [facility] and given bupropion and hydroxyzine but not consistent with medication.  We tried [place] but c/o vertigo and d/c. H/O cutting, passive and fleeting suicidal thoughts.  H/O ETOH, THC mushroom and using Xanax from the streets.  H/O violating restraing orders. Neurologist prescribe Effexor for headache but never took it.   Psychiatric Specialty Exam: Physical Exam  Review of Systems  Weight 185 lb (83.9 kg).There is no height or weight on file to calculate BMI.  General Appearance: NA  Eye Contact:  NA  Speech:  Slow  Volume:  Decreased  Mood:  Anxious, Depressed, Dysphoric, Hopeless and Irritable  Affect:  NA  Thought Process:  Descriptions of Associations: Intact  Orientation:  Full (Time, Place, and Person)  Thought Content:  Paranoid Ideation and Rumination  Suicidal Thoughts:  passive and fleeting thoughts but no plan  Homicidal Thoughts:  No  Memory:  Immediate;   Good Recent;   Good Remote;   Good  Judgement:  Fair  Insight:  Shallow  Psychomotor Activity:  NA  Concentration:  Concentration: Fair and  Attention Span: Fair  Recall:  Good  Fund of Knowledge:  Good  Language:  Good  Akathisia:  No  Handed:  Right  AIMS (if indicated):     Assets:  Communication Skills Desire for Improvement Housing Social Support  ADL's:  Intact  Cognition:  WNL  Sleep:   poor      Assessment and Plan: Mood disorder NOS.  Major depressive disorder, recurrent.  Cannabis use.  I reviewed his history and records.  Patient has history of not consistent  to follow the treatment plan.  He stopped taking medication in the past and recently not inconsistent with therapy.  Now he is serious to get help.  We talked about IOP and he is agreed to give a try.  He is not taking Abilify and we will try Symbyax 3/25 to help his mood, irritability, depression.  I also emphasized that he needs to stop smoking marijuana as medicine may not work.  We also integrate that he need to continue therapy to help his coping skills.  Patient has a court date July 19.  I discussed medication side effects, safety concerns at any time having active suicidal thoughts or homicidal thoughts then need to call 911 or go to local emergency room.  Follow-up in 3 weeks.  We will also send a message to IOP coordinator Dellia Nims as patient has shown interest to do IOP.  Time spent 25 minutes.  Follow Up Instructions:    I discussed the assessment and treatment plan with the patient. The patient was provided an opportunity to ask questions and all were answered. The patient agreed with the plan and demonstrated an understanding of the instructions.   The patient was advised to call back or seek an in-person evaluation if the symptoms worsen or if the condition fails to improve as anticipated.  I provided 25 minutes of non-face-to-face time during this encounter.   Kathlee Nations, MD

## 2020-04-13 ENCOUNTER — Telehealth (HOSPITAL_COMMUNITY): Payer: Self-pay | Admitting: Psychiatry

## 2020-04-13 NOTE — Telephone Encounter (Signed)
D:  Dr. Lolly Mustache referred pt to MH-IOP.  Pt was to return to work today to inquire if his schedule could be changed so he can attend in the mornings.  A:  Placed call to pt; he states that the head chef wasn't working today in order for him to inquire.  States the other chef plans to inquire for him tomorrow.  Inform  Dr. Lolly Mustache.  Encouraged pt to contact case manager tomorrow.

## 2020-05-04 ENCOUNTER — Other Ambulatory Visit: Payer: Self-pay

## 2020-05-04 ENCOUNTER — Telehealth (HOSPITAL_COMMUNITY): Payer: BC Managed Care – PPO | Admitting: Psychiatry

## 2020-06-07 IMAGING — CR CHEST - 2 VIEW
2 series · 2 of 2 positions shown · non-contrast
Comparison: 04/10/2018.

CLINICAL DATA: Mid to left lower chest pain since this morning.
Cough. Shortness of breath.

EXAM:
CHEST - 2 VIEW

[chest pa]
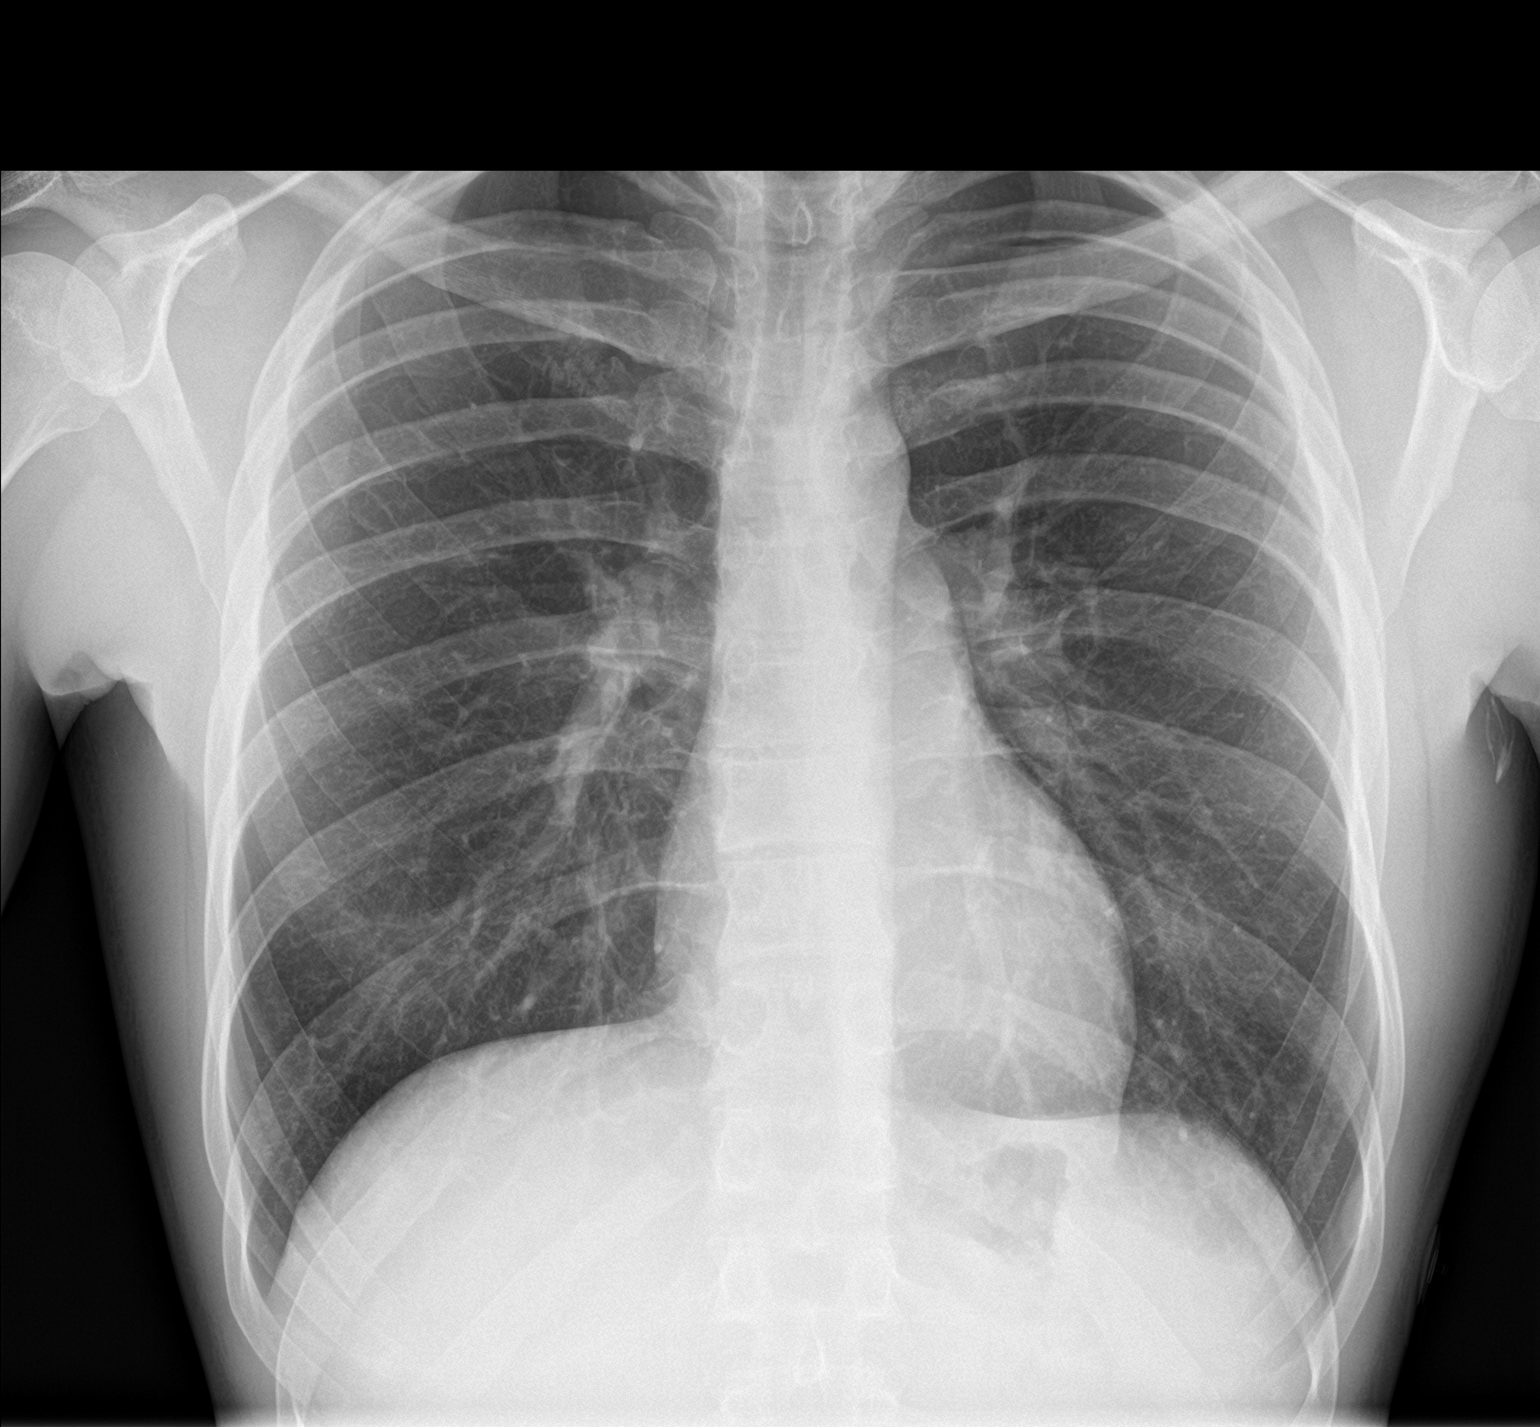

[chest lat]
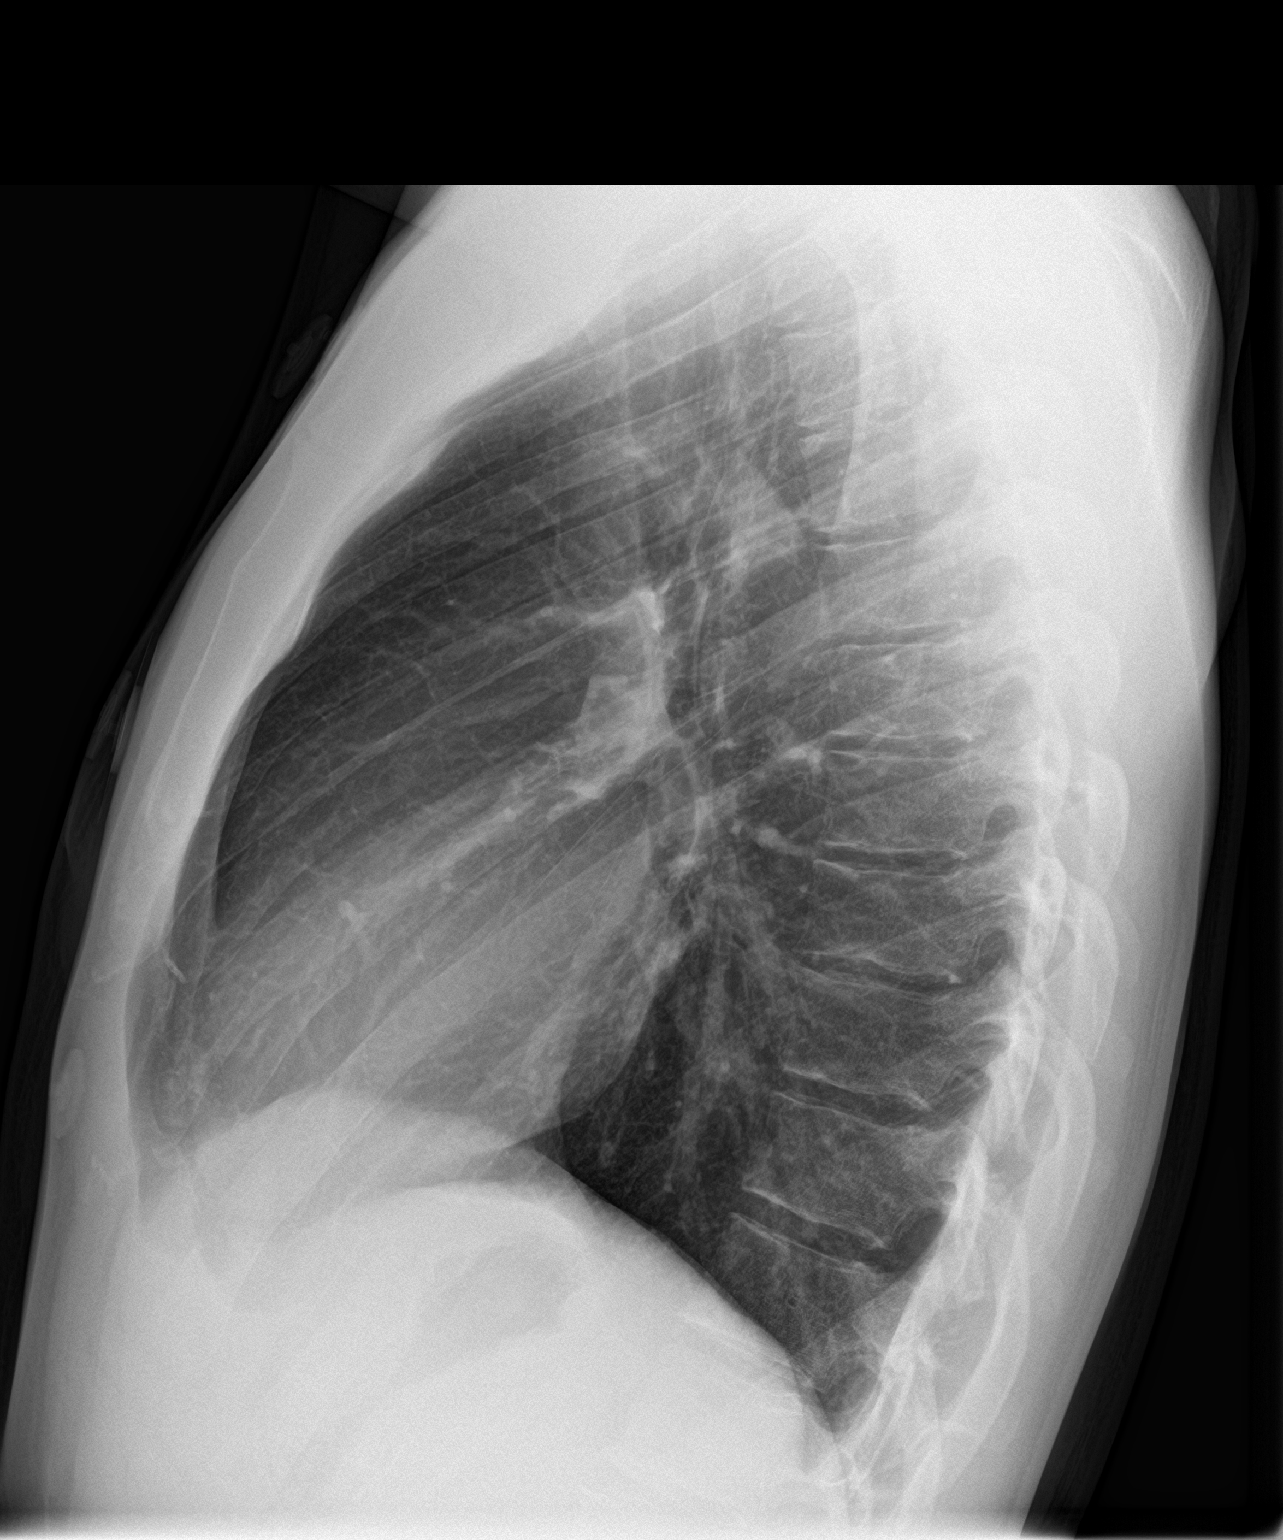

[2 of 2 positions shown; findings below may reference images not displayed]

FINDINGS: The heart size and mediastinal contours are within normal limits.
Both lungs are clear. The visualized skeletal structures are
unremarkable.
IMPRESSION: Normal examination.

## 2021-04-11 ENCOUNTER — Encounter (HOSPITAL_COMMUNITY): Payer: Self-pay

## 2021-04-11 ENCOUNTER — Ambulatory Visit (HOSPITAL_COMMUNITY)
Admission: EM | Admit: 2021-04-11 | Discharge: 2021-04-11 | Disposition: A | Discharge status: Home / Self Care | Payer: Self-pay | Attending: Urgent Care | Admitting: Urgent Care

## 2021-04-11 DIAGNOSIS — R300 Dysuria: Secondary | ICD-10-CM | POA: Insufficient documentation

## 2021-04-11 DIAGNOSIS — R350 Frequency of micturition: Secondary | ICD-10-CM | POA: Insufficient documentation

## 2021-04-11 LAB — POCT URINALYSIS DIPSTICK, ED / UC
Bilirubin Urine: NEGATIVE
Glucose, UA: NEGATIVE mg/dL
Hgb urine dipstick: NEGATIVE
Ketones, ur: NEGATIVE mg/dL
Leukocytes,Ua: NEGATIVE
Nitrite: NEGATIVE
Protein, ur: NEGATIVE mg/dL
Specific Gravity, Urine: 1.025 (ref 1.005–1.030)
Urobilinogen, UA: 1 mg/dL (ref 0.0–1.0)
pH: 6.5 (ref 5.0–8.0)

## 2021-04-11 NOTE — ED Provider Notes (Signed)
[facility]   MRN: [id number] DOB: July 21, 1996  Subjective:   Wyatt Alexander is a 25 y.o. male presenting for several week history of persistent intermittent penile discomfort, dysuria and worsening urinary frequency.  He is also had intermittent mild right testicle pain.  Reports that he has unprotected sex with 1 male partner only.  Both of them went and got STI testing, states that they were negative.  He is not opposed to recheck.  Does not drink much water, drinks fruit juices and sweet tea almost daily.  No current facility-administered medications for this encounter.  Current Outpatient Medications:  .  hydrOXYzine (VISTARIL) 25 MG capsule, Take 1 capsule (25 mg total) by mouth daily as needed for anxiety., Disp: 30 capsule, Rfl: 0 .  OLANZapine-FLUoxetine (SYMBYAX) 3-25 MG capsule, Take 1 capsule by mouth every evening., Disp: 30 capsule, Rfl: 0 .  pantoprazole (PROTONIX) 40 MG tablet, Take 40 mg by mouth daily., Disp: , Rfl: 1   No Known Allergies  Past Medical History:  Diagnosis Date  . Allergy   . Common migraine with intractable migraine 05/07/2018  . Depression   . Essential tremor   . Panic attacks      Past Surgical History:  Procedure Laterality Date  . FINGER SURGERY Left   . MINOR NAILBED REPAIR Left 09/13/2017   Procedure: IRRIGATION AND DEBRIDEMENT WITH REPAIR  LEFT INDEX FINGER;  Surgeon: Betha Loa, MD;  Location: [facility];  Service: Orthopedics;  Laterality: Left;  . WRIST SURGERY  2012   cyst removed    Family History  Problem Relation Age of Onset  . Hypertension Father   . Migraines Sister   . Heart attack Other     Social History   Tobacco Use  . Smoking status: Never Smoker  . Smokeless tobacco: Never Used  Vaping Use  . Vaping Use: Never used  Substance Use Topics  . Alcohol use: No  . Drug use: Yes    Types: Marijuana    ROS   Objective:   Vitals: BP 132/80 (BP Location: Right Arm)   Pulse  63   Temp 98.5 F (36.9 C) (Oral)   Resp 18   SpO2 100%   Physical Exam Constitutional:      General: He is not in acute distress.    Appearance: Normal appearance. He is well-developed and normal weight. He is not ill-appearing, toxic-appearing or diaphoretic.  HENT:     Head: Normocephalic and atraumatic.     Right Ear: External ear normal.     Left Ear: External ear normal.     Nose: Nose normal.     Mouth/Throat:     Pharynx: Oropharynx is clear.  Eyes:     General: No scleral icterus.       Right eye: No discharge.        Left eye: No discharge.     Extraocular Movements: Extraocular movements intact.     Pupils: Pupils are equal, round, and reactive to light.  Cardiovascular:     Rate and Rhythm: Normal rate.  Pulmonary:     Effort: Pulmonary effort is normal.  Abdominal:     Hernia: There is no hernia in the left inguinal area or right inguinal area.  Genitourinary:    Penis: Normal and circumcised. No phimosis, paraphimosis, hypospadias, erythema, tenderness, discharge, swelling or lesions.      Epididymis:     Right: Not inflamed or enlarged. No mass or tenderness.  Left: Not inflamed or enlarged. No mass or tenderness.  Musculoskeletal:     Cervical back: Normal range of motion.  Lymphadenopathy:     Lower Body: No right inguinal adenopathy. No left inguinal adenopathy.  Neurological:     Mental Status: He is alert and oriented to person, place, and time.  Psychiatric:        Mood and Affect: Mood normal.        Behavior: Behavior normal.        Thought Content: Thought content normal.        Judgment: Judgment normal.     Results for orders placed or performed during the hospital encounter of 04/11/21 (from the past 24 hour(s))  POC Urinalysis dipstick     Status: None   Collection Time: 04/11/21  4:56 PM  Result Value Ref Range   Glucose, UA NEGATIVE NEGATIVE mg/dL   Bilirubin Urine NEGATIVE NEGATIVE   Ketones, ur NEGATIVE NEGATIVE mg/dL    Specific Gravity, Urine 1.025 1.005 - 1.030   Hgb urine dipstick NEGATIVE NEGATIVE   pH 6.5 5.0 - 8.0   Protein, ur NEGATIVE NEGATIVE mg/dL   Urobilinogen, UA 1.0 0.0 - 1.0 mg/dL   Nitrite NEGATIVE NEGATIVE   Leukocytes,Ua NEGATIVE NEGATIVE    Assessment and Plan :   PDMP not reviewed this encounter.  1. Urinary frequency   2. Dysuria     STI testing pending.  Recommended patient hydrate better with plain water and avoid urinary irritants. Counseled patient on potential for adverse effects with medications prescribed/recommended today, ER and return-to-clinic precautions discussed, patient verbalized understanding.    Wallis Bamberg, [place] 04/11/21 1738

## 2021-04-11 NOTE — ED Triage Notes (Addendum)
Pt in with c/o abdominal pain, testicle swelling and pain on his penis that he noticed a few weeks ago  Pt states he had std testing with negative results  Pt has been taking aspirin for pain  Pt also states he feels like when he urinates his stream does not stop  Took at at home UTI test

## 2021-04-11 NOTE — Discharge Instructions (Signed)

## 2021-04-12 LAB — CYTOLOGY, (ORAL, ANAL, URETHRAL) ANCILLARY ONLY
Chlamydia: NEGATIVE
Comment: NEGATIVE
Comment: NEGATIVE
Comment: NORMAL
Neisseria Gonorrhea: NEGATIVE
Trichomonas: NEGATIVE
